# Patient Record
Sex: Female | Born: 1975 | Race: White | Hispanic: No | Smoking: Never smoker
Health system: Southern US, Community
[De-identification: ages and names within clinical notes are randomized; demographics above are authoritative.]

## PROBLEM LIST (undated history)

## (undated) DIAGNOSIS — T7840XA Allergy, unspecified, initial encounter: Secondary | ICD-10-CM

## (undated) DIAGNOSIS — G4700 Insomnia, unspecified: Secondary | ICD-10-CM

## (undated) HISTORY — DX: Insomnia, unspecified: G47.00

## (undated) HISTORY — PX: ELBOW SURGERY: SHX618

## (undated) HISTORY — PX: PARTIAL HYSTERECTOMY: SHX80

## (undated) HISTORY — PX: FINGER AMPUTATION: SHX636

## (undated) HISTORY — DX: Allergy, unspecified, initial encounter: T78.40XA

---

## 2001-06-19 ENCOUNTER — Other Ambulatory Visit: Admission: RE | Admit: 2001-06-19 | Discharge: 2001-06-19 | Payer: Self-pay | Admitting: Family Medicine

## 2003-04-11 ENCOUNTER — Ambulatory Visit (HOSPITAL_COMMUNITY): Admission: RE | Admit: 2003-04-11 | Discharge: 2003-04-11 | Payer: Self-pay | Admitting: Family Medicine

## 2003-10-24 ENCOUNTER — Ambulatory Visit (HOSPITAL_COMMUNITY): Admission: RE | Admit: 2003-10-24 | Discharge: 2003-10-24 | Payer: Self-pay | Admitting: Orthopedic Surgery

## 2005-04-15 ENCOUNTER — Encounter (INDEPENDENT_AMBULATORY_CARE_PROVIDER_SITE_OTHER): Payer: Self-pay | Admitting: *Deleted

## 2005-04-15 ENCOUNTER — Inpatient Hospital Stay (HOSPITAL_COMMUNITY): Admission: RE | Admit: 2005-04-15 | Discharge: 2005-04-17 | Payer: Self-pay | Admitting: *Deleted

## 2007-09-24 ENCOUNTER — Emergency Department (HOSPITAL_COMMUNITY): Admission: EM | Admit: 2007-09-24 | Discharge: 2007-09-24 | Payer: Self-pay | Admitting: Emergency Medicine

## 2007-10-05 ENCOUNTER — Emergency Department (HOSPITAL_COMMUNITY): Admission: EM | Admit: 2007-10-05 | Discharge: 2007-10-05 | Payer: Self-pay | Admitting: Emergency Medicine

## 2007-10-17 ENCOUNTER — Emergency Department (HOSPITAL_COMMUNITY): Admission: EM | Admit: 2007-10-17 | Discharge: 2007-10-17 | Payer: Self-pay | Admitting: Emergency Medicine

## 2007-11-20 ENCOUNTER — Ambulatory Visit (HOSPITAL_COMMUNITY): Admission: RE | Admit: 2007-11-20 | Discharge: 2007-11-20 | Payer: Self-pay | Admitting: Family Medicine

## 2010-06-26 NOTE — Discharge Summary (Signed)
NAME:  [name] Ball, [name] Ball                  ACCOUNT NO.:  [id number]   MEDICAL RECORD NO.:  [id number]          PATIENT TYPE:  INP   LOCATION:  A428                          FACILITY:  [facility]   PHYSICIAN:  Langley Gauss, MD     DATE OF BIRTH:  July 15, 1975   DATE OF ADMISSION:  04/15/2005  DATE OF DISCHARGE:  03/10/2007LH                                 DISCHARGE SUMMARY   DISPOSITION:  The patient is to follow up in the office in 4 days' time for  removal of retention sutures as well as skin staples.  The two JP drains are  left within the subcutaneous space.  The patient is aware of how to empty  these drains.  The patient is given a copy of standard discharge  instructions.   DISCHARGE MEDICATIONS:  Tylox #40 without refill.   PERTINENT LABORATORY STUDIES:  Admission hemoglobin and hematocrit 12.1 and  36.6.  Postoperative day #1, 10.2 and 30.7.  RPR is not performed.  HIV is  nonreactive.   HOSPITAL COURSE:  The patient admitted through outpatient surgery on April 15, 2005.  TAH and bilateral ovarian cystectomy performed without  complications.  Postoperatively the patient did well, she had excellent  urine output, she was able to ambulate and void without difficulty.  Foley  catheter was removed on postoperative day #1.  The patient did initially  have PCA Dilaudid ordered however this failed to achieve any significant  therapeutic response thus the patient was changed to intermittent injections  of IV morphine and Phenergan which did very well for postoperative pain  relief.  The patient remained afebrile.  She began having some gas pains on  postoperative day #1 however on postoperative day #2 the patient is now  fully ambulatory, voiding without difficulty and passing flatus indicating  resumption of bowel function.  She is having no complaints of hot flashes.  She is thus discharged to home on April 17, 2005.  The two JP drains are  secured into place and draining less than 30 mL per  shift, they are  aspirated prior to discharge, no clots are noted and there appears to be  clearing of the drainage noted.  The patient subsequently to follow up in  the office in 4 days' time.      Langley Gauss, MD  Electronically Signed     DC/MEDQ  D:  04/17/2005  T:  04/18/2005  Job:  [id number]
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
## 2010-06-26 NOTE — Op Note (Signed)
NAME:  [name] Ball, [name] Ball                  ACCOUNT NO.:  [id number]   MEDICAL RECORD NO.:  [id number]          PATIENT TYPE:  INP   LOCATION:  A428                          FACILITY:  [facility]   PHYSICIAN:  Langley Gauss, MD     DATE OF BIRTH:  06/15/1975   DATE OF PROCEDURE:  04/15/2005  DATE OF DISCHARGE:  04/17/2005                                 OPERATIVE REPORT   PREOPERATIVE DIAGNOSES:  1.  Dysmenorrhea.  2.  Menorrhagia.   PROCEDURE PERFORMED:  1.  Total abdominal hysterectomy.  2.  Right and left ovarian cystectomies.   SURGEON:  Roylene Reason. Lisette Grinder, MD.   ESTIMATED BLOOD LOSS:  300 mL.   ANALGESIA:  General endotracheal.   DRAINS:  Foley catheter draining the bladder.  Two JP drains are placed in  the subcutaneous space.   TOTAL ESTIMATED BLOOD LOSS:  300 mL.   FINDINGS:  Include a retroflexed uterus with a diffusely enlarged fundal  portion, enlarged ovary with findings consistent with corpora albicans, and  multicystic left ovary with normal-appearing developing follicles.   SUMMARY:  Planned procedure was discussed with the patient. She desired  retention of her ovaries for ovarian functioning.  The patient's vital signs  are stable. She is taken to the operating room. She underwent uncomplicated  induction of general anesthesia and prepped and draped in the usual sterile  manner.  Foley catheter sterilely placed to straight drainage.   A knife is used to excise the redundant skin of the abdominal wall utilizing  a Pfannenstiel's incision in an elliptical fashion.  Subcutaneous bleeders  were then cauterized.  We dissected down to the fascial plane utilizing  cautery. The fascia was then incised in transverse curvilinear manner while  sharply dissecting off the underlying rectus muscle. The fascial edges were  grasped using straight Kocher clamps. Fascia is dissected off the underlying  rectus muscle in the midline both superiorly and inferiorly in the avascular  plane.   Rectus muscles are bluntly separated. Peritoneal cavity is  atraumatically, bluntly entered, at the superior-most portion of the  incision. Peritoneal incision extended superiorly and inferiorly. Inferiorly  we directly palpated the bladder to avoid its accidental injury.   Balfour self-retaining retractor was then placed utilizing shallow blades.  Three moist packs are used to mobilize bowel out of the operative field.  Findings as described previously. Long straight Kocher clamps are used to  grasp the specimen at the junction of the round ligament and utero-ovarian  ligament with the uterus. This allowed manipulation of the operative  specimen. The right round ligament is clamped with Kelly clamp for back  bleeding; #0 Vicryl suture in a Heaney fashion is utilized to secure the  right round ligament. It then transected and skeletonization of the utero-  ovarian ligament is performed on the right.  In the clear space of the broad  ligament a defect. The utero-ovarian ligament is doubly clamped with Kelly  clamps followed by double ligature with free tie of #0 Vicryl, followed by  suture ligature of #0 Vicryl in a Heaney fashion to  secure hemostasis.   The identical procedure was formed on the left with #0 Vicryl in a Heaney  fashion on the round ligament, followed by skeletonization of the utero-  ovarian ligament, followed by double ligation of the left uterine ovarian  ligament with free tie followed by suture ligature. These pedicles were then  secured. This then allowed me to continue the dissection in the broad  ligament across the anterior lower uterine segment in the avascular plane.  This also skeletonizes the uterine vessels bilaterally. The bladder flap was  then bluntly dissected off the anterior lower uterine segment and pushed  distal to the cervix. The right uterine vessel has a Kelly clamp placed for  back bleeding followed by a curved Heaney clamp, followed by #0  Vicryl  sutures to secure hemostasis. Left uterine vessel is clamped with a curved  Heaney clamp, followed by #0 Vicryl suture to likewise secure hemostasis.   The cervix is palpated, a straight Heaney clamp is placed on the right  cardinal ligament very close to the cervix. Suture ligature of #0 Vicryl in  a Heaney fashion is utilized. Left cardinal ligament is likewise clamped  with a straight Heaney clamp, followed by suture ligature of #0 Vicryl in  Heaney fashion.  This then brought me down to the vaginal angle; the right  vaginal angle incorporating the uterosacral ligament is clamped with a  curved Heaney clamp on the right, followed by suture ligature of #0 Vicryl  in a Heaney fashion. Left vaginal angle is likewise clamped with a curved  Heaney clamp, suture ligature of #0 Vicryl in a Heaney fashion is also  placed. These had been tagged for later inspection. This allows me to  dissect across the vaginal cuff removing the entirety of the operative  specimen. Cervix was examined and noted be contained its entirety in the  operative specimen. The vaginal cuff was then closed with figure-of-eight  sutures of #0 Vicryl to secure hemostasis.   This results in an excellent closure. No active bleeding is noted from the  vaginal cuff.  However, some bleeding is noted in the area of the broad  ligament bilaterally where dissection had been performed to skeletonize the  utero-ovarian ligament.  Thus 3-0 Vicryl suture in a continuous fashion is  used to reperitonealized on both the right and left which also results in  hemostasis. The right ovary is noted to be diffusely enlarged. Manipulation  results in rupture of a cyst which then is noted to peel away. Cyst wall is  removed. This is consistent with a corpora albicans.  The right ovary is  noted to have some active bleeding from this site which is then controlled utilizing a series of interrupted #0 Vicryl sutures to secure hemostasis.   The left ovary is likewise examined, a small follicular cyst is noted to  rupture. The follicular cyst wall is removed and figure-of-eight sutures of  #0 Vicryl are used to reapproximate the ovarian stroma. In an effort to  avoid postoperative dyspareunia, each of the ovaries is then elevated with a  suture placed through the ovarian stroma into the round ligament, then for  very gentle elevation.   Irrigation is then performed of the pelvic cavity to assure hemostasis.  Irrigation is formed until irrigant is clear. Sponge, needle, and instrument  counts are correct x2, at this point. Thus moist packs are removed. Large  bowel was placed within the pelvis with overlying omentum peritoneal edges  are grasped using  Kelly clamps. The peritoneum and overlying rectus muscles  closed with a continuous running #0 Vicryl suture. Subfascial bleeders are  cauterized. The fascia is then closed with a continuous running #1 PDS  suture. Subcutaneous bleeders are cauterized and two JP drains are placed in  the subcutaneous space with exit wounds to left-and-right apex of the  incision. These are sutured into place. Three horizontal mattress sutures of  #1 PDS were then placed as retention-type sutures. Skin is closed utilizing  skin staples. A total of 30 mL of 0.5% bupivacaine plain is then injected  along the incision to facilitate  postoperative analgesia. The patient's vital signs remained stable. She is  then reversed of anesthesia, taken to the recovery room in stable condition.  Operative findings then discussed with the patient's mother who is in the  postoperative waiting area. The patient is in the recovery room in stable  condition.      Langley Gauss, MD  Electronically Signed     DC/MEDQ  D:  04/17/2005  T:  04/19/2005  Job:  [id number]

## 2010-11-11 LAB — DIFFERENTIAL
Basophils Absolute: 0
Basophils Relative: 0
Eosinophils Absolute: 0.1
Eosinophils Relative: 1
Lymphocytes Relative: 10 — ABNORMAL LOW
Lymphs Abs: 1
Monocytes Absolute: 0.4
Monocytes Relative: 4
Neutrophils Relative %: 85 — ABNORMAL HIGH

## 2010-11-11 LAB — URINALYSIS, ROUTINE W REFLEX MICROSCOPIC
Glucose, UA: NEGATIVE
Leukocytes, UA: NEGATIVE
Specific Gravity, Urine: >1.03 — ABNORMAL HIGH
pH: 5.5

## 2010-11-11 LAB — BASIC METABOLIC PANEL
CO2: 29
Chloride: 105
GFR calc Af Amer: >60
GFR calc non Af Amer: >60
Potassium: 3.7
Sodium: 141

## 2010-11-11 LAB — CBC
Hemoglobin: 14.3
MCHC: 34.3
WBC: 10.2

## 2010-11-11 LAB — URINE MICROSCOPIC-ADD ON

## 2011-10-16 ENCOUNTER — Emergency Department (HOSPITAL_COMMUNITY): Payer: Managed Care, Other (non HMO)

## 2011-10-16 ENCOUNTER — Emergency Department (HOSPITAL_COMMUNITY)
Admission: EM | Admit: 2011-10-16 | Discharge: 2011-10-16 | Disposition: A | Discharge status: Home / Self Care | Payer: Managed Care, Other (non HMO) | Attending: Emergency Medicine | Admitting: Emergency Medicine

## 2011-10-16 ENCOUNTER — Encounter (HOSPITAL_COMMUNITY): Payer: Self-pay | Admitting: Emergency Medicine

## 2011-10-16 DIAGNOSIS — S51019A Laceration without foreign body of unspecified elbow, initial encounter: Secondary | ICD-10-CM

## 2011-10-16 DIAGNOSIS — S51009A Unspecified open wound of unspecified elbow, initial encounter: Secondary | ICD-10-CM | POA: Insufficient documentation

## 2011-10-16 DIAGNOSIS — IMO0002 Reserved for concepts with insufficient information to code with codable children: Secondary | ICD-10-CM | POA: Insufficient documentation

## 2011-10-16 DIAGNOSIS — M25529 Pain in unspecified elbow: Secondary | ICD-10-CM | POA: Insufficient documentation

## 2011-10-16 DIAGNOSIS — M79609 Pain in unspecified limb: Secondary | ICD-10-CM | POA: Insufficient documentation

## 2011-10-16 DIAGNOSIS — S62339A Displaced fracture of neck of unspecified metacarpal bone, initial encounter for closed fracture: Secondary | ICD-10-CM | POA: Insufficient documentation

## 2011-10-16 DIAGNOSIS — Z23 Encounter for immunization: Secondary | ICD-10-CM | POA: Insufficient documentation

## 2011-10-16 DIAGNOSIS — R079 Chest pain, unspecified: Secondary | ICD-10-CM | POA: Insufficient documentation

## 2011-10-16 DIAGNOSIS — S62309A Unspecified fracture of unspecified metacarpal bone, initial encounter for closed fracture: Secondary | ICD-10-CM

## 2011-10-16 DIAGNOSIS — T07XXXA Unspecified multiple injuries, initial encounter: Secondary | ICD-10-CM

## 2011-10-16 MED ORDER — ONDANSETRON 4 MG PO TBDP
ORAL_TABLET | ORAL | Status: AC
  Administered 2011-10-16: 07:00:00
  Filled 2011-10-16: qty 1

## 2011-10-16 MED ORDER — HYDROMORPHONE HCL PF 2 MG/ML IJ SOLN
2.0000 mg | Freq: Once | INTRAMUSCULAR | Status: AC
  Administered 2011-10-16: 2 mg via INTRAMUSCULAR
  Filled 2011-10-16: qty 1

## 2011-10-16 MED ORDER — OXYCODONE-ACETAMINOPHEN 5-325 MG PO TABS: 1.0000 | ORAL_TABLET | ORAL | Status: DC | PRN

## 2011-10-16 MED ORDER — OXYCODONE-ACETAMINOPHEN 5-325 MG PO TABS
2.0000 | ORAL_TABLET | Freq: Once | ORAL | Status: AC
  Administered 2011-10-16: 2 via ORAL
  Filled 2011-10-16: qty 2

## 2011-10-16 MED ORDER — TETANUS-DIPHTH-ACELL PERTUSSIS 5-2.5-18.5 LF-MCG/0.5 IM SUSP
0.5000 mL | Freq: Once | INTRAMUSCULAR | Status: AC
  Administered 2011-10-16: 0.5 mL via INTRAMUSCULAR
  Filled 2011-10-16: qty 0.5
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
## 2011-10-16 NOTE — ED Notes (Signed)
[employer] at bedside 

## 2011-10-16 NOTE — ED Notes (Signed)
PT ambulated with baseline gait; VSS; A&Ox3; no signs of distress; respirations even and unlabored; skin warm and dry; no questions upon discharge.  

## 2011-10-16 NOTE — ED Notes (Signed)
Wound irrigated with 4000 L NS; suture cart at bedside.

## 2011-10-16 NOTE — ED Notes (Signed)
Dr. Effie Shy sutured and cleaned wound. Ortho to be called.

## 2011-10-16 NOTE — ED Notes (Signed)
Ortho at bedside.

## 2011-10-16 NOTE — ED Notes (Signed)
PT no longer nauseated.
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
## 2011-10-16 NOTE — ED Notes (Addendum)
Pt removed from backboard; no c/o back pain; c collar remains in place.

## 2011-10-18 ENCOUNTER — Other Ambulatory Visit (HOSPITAL_COMMUNITY): Payer: Self-pay | Admitting: Orthopaedic Surgery

## 2011-10-18 ENCOUNTER — Encounter (HOSPITAL_COMMUNITY): Payer: Self-pay | Admitting: Anesthesiology

## 2011-10-18 ENCOUNTER — Encounter (HOSPITAL_COMMUNITY): Payer: Self-pay | Admitting: *Deleted

## 2011-10-18 ENCOUNTER — Inpatient Hospital Stay (HOSPITAL_COMMUNITY)
Admission: AD | Admit: 2011-10-18 | Discharge: 2011-10-26 | DRG: 577 | Disposition: A | Discharge status: Home / Self Care | Payer: Managed Care, Other (non HMO) | Source: Ambulatory Visit | Attending: Orthopaedic Surgery | Admitting: Orthopaedic Surgery

## 2011-10-18 ENCOUNTER — Inpatient Hospital Stay (HOSPITAL_COMMUNITY): Payer: Managed Care, Other (non HMO) | Admitting: Anesthesiology

## 2011-10-18 ENCOUNTER — Encounter (HOSPITAL_COMMUNITY)
Admission: AD | Disposition: A | Discharge status: Home / Self Care | Payer: Self-pay | Source: Ambulatory Visit | Attending: Orthopaedic Surgery

## 2011-10-18 DIAGNOSIS — IMO0002 Reserved for concepts with insufficient information to code with codable children: Principal | ICD-10-CM | POA: Diagnosis present

## 2011-10-18 DIAGNOSIS — L089 Local infection of the skin and subcutaneous tissue, unspecified: Secondary | ICD-10-CM

## 2011-10-18 DIAGNOSIS — B961 Klebsiella pneumoniae [K. pneumoniae] as the cause of diseases classified elsewhere: Secondary | ICD-10-CM | POA: Diagnosis present

## 2011-10-18 DIAGNOSIS — A498 Other bacterial infections of unspecified site: Secondary | ICD-10-CM | POA: Diagnosis present

## 2011-10-18 DIAGNOSIS — Y998 Other external cause status: Secondary | ICD-10-CM

## 2011-10-18 HISTORY — PX: I&D EXTREMITY: SHX5045

## 2011-10-18 LAB — BASIC METABOLIC PANEL
BUN: 7 mg/dL (ref 6–23)
Calcium: 9.5 mg/dL (ref 8.4–10.5)
Creatinine, Ser: 0.69 mg/dL (ref 0.50–1.10)
GFR calc non Af Amer: >90 mL/min (ref >90)
Glucose, Bld: 100 mg/dL — ABNORMAL HIGH (ref 70–99)
Sodium: 134 mEq/L — ABNORMAL LOW (ref 135–145)

## 2011-10-18 LAB — CBC
MCV: 88 fL (ref 78.0–100.0)
Platelets: 225 10*3/uL (ref 150–400)
RBC: 4.35 MIL/uL (ref 3.87–5.11)
WBC: 15 10*3/uL — ABNORMAL HIGH (ref 4.0–10.5)

## 2011-10-18 LAB — GRAM STAIN

## 2011-10-18 SURGERY — IRRIGATION AND DEBRIDEMENT EXTREMITY
Anesthesia: General | Site: Elbow | Laterality: Right | Wound class: Dirty or Infected

## 2011-10-18 MED ORDER — CLINDAMYCIN PHOSPHATE 600 MG/50ML IV SOLN
INTRAVENOUS | Status: AC
  Filled 2011-10-18: qty 50

## 2011-10-18 MED ORDER — PROPOFOL 10 MG/ML IV EMUL
INTRAVENOUS | Status: DC | PRN
  Administered 2011-10-18: 200 mg via INTRAVENOUS

## 2011-10-18 MED ORDER — CLINDAMYCIN PHOSPHATE 600 MG/50ML IV SOLN
INTRAVENOUS | Status: DC | PRN
  Administered 2011-10-18: 600 mg via INTRAVENOUS

## 2011-10-18 MED ORDER — VANCOMYCIN HCL IN DEXTROSE 1-5 GM/200ML-% IV SOLN
1000.0000 mg | Freq: Three times a day (TID) | INTRAVENOUS | Status: DC
  Administered 2011-10-18 – 2011-10-20 (×5): 1000 mg via INTRAVENOUS
  Filled 2011-10-18 (×6): qty 200

## 2011-10-18 MED ORDER — HYDROMORPHONE HCL PF 1 MG/ML IJ SOLN
INTRAMUSCULAR | Status: AC
  Administered 2011-10-18: 0.5 mg via INTRAVENOUS
  Filled 2011-10-18: qty 1

## 2011-10-18 MED ORDER — ONDANSETRON HCL 4 MG/2ML IJ SOLN: 4.0000 mg | Freq: Four times a day (QID) | INTRAMUSCULAR | Status: DC | PRN

## 2011-10-18 MED ORDER — ONDANSETRON HCL 4 MG PO TABS: 4.0000 mg | ORAL_TABLET | Freq: Four times a day (QID) | ORAL | Status: DC | PRN

## 2011-10-18 MED ORDER — ACETAMINOPHEN 10 MG/ML IV SOLN
INTRAVENOUS | Status: DC | PRN
  Administered 2011-10-18: 1000 mg via INTRAVENOUS

## 2011-10-18 MED ORDER — OXYCODONE-ACETAMINOPHEN 5-325 MG PO TABS
ORAL_TABLET | ORAL | Status: AC
  Filled 2011-10-18: qty 1

## 2011-10-18 MED ORDER — METHOCARBAMOL 500 MG PO TABS
500.0000 mg | ORAL_TABLET | Freq: Four times a day (QID) | ORAL | Status: DC | PRN
  Administered 2011-10-22 – 2011-10-25 (×8): 500 mg via ORAL
  Filled 2011-10-18 (×7): qty 1

## 2011-10-18 MED ORDER — OXYCODONE HCL 5 MG PO TABS
5.0000 mg | ORAL_TABLET | ORAL | Status: DC | PRN
  Administered 2011-10-18 – 2011-10-26 (×19): 10 mg via ORAL
  Filled 2011-10-18 (×18): qty 2

## 2011-10-18 MED ORDER — METOCLOPRAMIDE HCL 10 MG PO TABS: 5.0000 mg | ORAL_TABLET | Freq: Three times a day (TID) | ORAL | Status: DC | PRN

## 2011-10-18 MED ORDER — MORPHINE SULFATE 2 MG/ML IJ SOLN
1.0000 mg | INTRAMUSCULAR | Status: DC | PRN
  Administered 2011-10-18 – 2011-10-19 (×2): 1 mg via INTRAVENOUS
  Filled 2011-10-18: qty 1

## 2011-10-18 MED ORDER — SODIUM CHLORIDE 0.9 % IR SOLN
Status: DC | PRN
  Administered 2011-10-18: 9000 mL

## 2011-10-18 MED ORDER — HYDROCODONE-ACETAMINOPHEN 5-325 MG PO TABS
1.0000 | ORAL_TABLET | ORAL | Status: DC | PRN
  Administered 2011-10-19 – 2011-10-26 (×11): 2 via ORAL
  Filled 2011-10-18 (×13): qty 2

## 2011-10-18 MED ORDER — MIDAZOLAM HCL 5 MG/5ML IJ SOLN
INTRAMUSCULAR | Status: DC | PRN
  Administered 2011-10-18: 2 mg via INTRAVENOUS

## 2011-10-18 MED ORDER — LIDOCAINE HCL (CARDIAC) 20 MG/ML IV SOLN
INTRAVENOUS | Status: DC | PRN
  Administered 2011-10-18: 80 mg via INTRAVENOUS

## 2011-10-18 MED ORDER — OXYCODONE-ACETAMINOPHEN 5-325 MG PO TABS
1.0000 | ORAL_TABLET | Freq: Once | ORAL | Status: AC
  Administered 2011-10-18: 1 via ORAL

## 2011-10-18 MED ORDER — DIPHENHYDRAMINE HCL 12.5 MG/5ML PO ELIX
12.5000 mg | ORAL_SOLUTION | ORAL | Status: DC | PRN
  Administered 2011-10-24 (×2): 12.5 mg via ORAL
  Administered 2011-10-24 – 2011-10-26 (×6): 25 mg via ORAL
  Filled 2011-10-18 (×4): qty 5
  Filled 2011-10-18: qty 10
  Filled 2011-10-18 (×3): qty 5

## 2011-10-18 MED ORDER — DOCUSATE SODIUM 100 MG PO CAPS
100.0000 mg | ORAL_CAPSULE | Freq: Two times a day (BID) | ORAL | Status: DC
  Administered 2011-10-18 – 2011-10-26 (×16): 100 mg via ORAL
  Filled 2011-10-18 (×20): qty 1

## 2011-10-18 MED ORDER — SODIUM CHLORIDE 0.9 % IV SOLN
INTRAVENOUS | Status: DC
  Administered 2011-10-23: 11:00:00 via INTRAVENOUS
  Administered 2011-10-24: 75 mL/h via INTRAVENOUS

## 2011-10-18 MED ORDER — ACETAMINOPHEN 10 MG/ML IV SOLN
INTRAVENOUS | Status: AC
  Filled 2011-10-18: qty 100

## 2011-10-18 MED ORDER — FENTANYL CITRATE 0.05 MG/ML IJ SOLN
INTRAMUSCULAR | Status: DC | PRN
  Administered 2011-10-18 (×3): 25 ug via INTRAVENOUS
  Administered 2011-10-18: 50 ug via INTRAVENOUS
  Administered 2011-10-18 (×5): 25 ug via INTRAVENOUS

## 2011-10-18 MED ORDER — MORPHINE SULFATE 2 MG/ML IJ SOLN
INTRAMUSCULAR | Status: AC
  Filled 2011-10-18: qty 1

## 2011-10-18 MED ORDER — LACTATED RINGERS IV SOLN
INTRAVENOUS | Status: DC | PRN
  Administered 2011-10-18 (×2): via INTRAVENOUS

## 2011-10-18 MED ORDER — ONDANSETRON HCL 4 MG/2ML IJ SOLN
INTRAMUSCULAR | Status: DC | PRN
  Administered 2011-10-18: 4 mg via INTRAVENOUS

## 2011-10-18 MED ORDER — MUPIROCIN 2 % EX OINT
TOPICAL_OINTMENT | Freq: Two times a day (BID) | CUTANEOUS | Status: DC
  Administered 2011-10-18: 1 via NASAL

## 2011-10-18 MED ORDER — HYDROMORPHONE HCL PF 1 MG/ML IJ SOLN
0.2500 mg | INTRAMUSCULAR | Status: DC | PRN
  Administered 2011-10-18 (×4): 0.5 mg via INTRAVENOUS

## 2011-10-18 MED ORDER — MUPIROCIN 2 % EX OINT
TOPICAL_OINTMENT | CUTANEOUS | Status: AC
  Administered 2011-10-18: 1 via NASAL
  Filled 2011-10-18: qty 22

## 2011-10-18 MED ORDER — PIPERACILLIN-TAZOBACTAM 3.375 G IVPB
3.3750 g | Freq: Three times a day (TID) | INTRAVENOUS | Status: DC
  Administered 2011-10-18 – 2011-10-22 (×10): 3.375 g via INTRAVENOUS
  Filled 2011-10-18 (×13): qty 50

## 2011-10-18 MED ORDER — LACTATED RINGERS IV SOLN
INTRAVENOUS | Status: DC
  Administered 2011-10-18: 18:00:00 via INTRAVENOUS

## 2011-10-18 MED ORDER — DEXTROSE 5 % IV SOLN
500.0000 mg | INTRAVENOUS | Status: DC
  Administered 2011-10-18: 500 mg via INTRAVENOUS
  Filled 2011-10-18: qty 5

## 2011-10-18 MED ORDER — CLINDAMYCIN PHOSPHATE 900 MG/50ML IV SOLN: 900.0000 mg | INTRAVENOUS | Status: DC

## 2011-10-18 MED ORDER — HYDROMORPHONE HCL PF 1 MG/ML IJ SOLN
1.0000 mg | INTRAMUSCULAR | Status: DC | PRN
  Administered 2011-10-19 – 2011-10-24 (×26): 1 mg via INTRAVENOUS
  Filled 2011-10-18 (×31): qty 1

## 2011-10-18 MED ORDER — METOCLOPRAMIDE HCL 5 MG/ML IJ SOLN
5.0000 mg | Freq: Three times a day (TID) | INTRAMUSCULAR | Status: DC | PRN
Start: 2011-10-18 — End: 2011-10-26

## 2011-10-18 MED ORDER — ONDANSETRON HCL 4 MG/2ML IJ SOLN
4.0000 mg | Freq: Four times a day (QID) | INTRAMUSCULAR | Status: DC | PRN
  Administered 2011-10-22: 4 mg via INTRAVENOUS
  Filled 2011-10-18 (×2): qty 2

## 2011-10-18 MED ORDER — ZOLPIDEM TARTRATE 5 MG PO TABS
5.0000 mg | ORAL_TABLET | Freq: Every evening | ORAL | Status: DC | PRN
  Administered 2011-10-18: 5 mg via ORAL
  Filled 2011-10-18: qty 1

## 2011-10-18 MED ORDER — CEFAZOLIN SODIUM-DEXTROSE 2-3 GM-% IV SOLR
INTRAVENOUS | Status: AC
  Filled 2011-10-18: qty 50

## 2011-10-18 MED ORDER — METHOCARBAMOL 100 MG/ML IJ SOLN
500.0000 mg | Freq: Four times a day (QID) | INTRAVENOUS | Status: DC | PRN
  Filled 2011-10-18: qty 5

## 2011-10-18 SURGICAL SUPPLY — 56 items
BANDAGE CONFORM 3  STR LF (GAUZE/BANDAGES/DRESSINGS) IMPLANT
BANDAGE ELASTIC 3 VELCRO ST LF (GAUZE/BANDAGES/DRESSINGS) IMPLANT
BLADE SURG 10 STRL SS (BLADE) ×2 IMPLANT
BNDG COHESIVE 1X5 TAN STRL LF (GAUZE/BANDAGES/DRESSINGS) IMPLANT
BNDG COHESIVE 4X5 TAN STRL (GAUZE/BANDAGES/DRESSINGS) ×2 IMPLANT
BNDG COHESIVE 6X5 TAN STRL LF (GAUZE/BANDAGES/DRESSINGS) ×4 IMPLANT
BNDG GAUZE STRTCH 6 (GAUZE/BANDAGES/DRESSINGS) ×6 IMPLANT
CLOTH BEACON ORANGE TIMEOUT ST (SAFETY) ×2 IMPLANT
CORDS BIPOLAR (ELECTRODE) IMPLANT
COVER SURGICAL LIGHT HANDLE (MISCELLANEOUS) ×2 IMPLANT
CUFF TOURNIQUET SINGLE 18IN (TOURNIQUET CUFF) ×2 IMPLANT
CUFF TOURNIQUET SINGLE 24IN (TOURNIQUET CUFF) IMPLANT
CUFF TOURNIQUET SINGLE 34IN LL (TOURNIQUET CUFF) IMPLANT
CUFF TOURNIQUET SINGLE 44IN (TOURNIQUET CUFF) IMPLANT
DRAPE ORTHO SPLIT 77X108 STRL (DRAPES) ×2
DRAPE SURG 17X23 STRL (DRAPES) IMPLANT
DRAPE SURG ORHT 6 SPLT 77X108 (DRAPES) ×2 IMPLANT
DRAPE U-SHAPE 47X51 STRL (DRAPES) ×2 IMPLANT
DRSG ADAPTIC 3X8 NADH LF (GAUZE/BANDAGES/DRESSINGS) ×2 IMPLANT
DRSG VAC ATS MED SENSATRAC (GAUZE/BANDAGES/DRESSINGS) ×2 IMPLANT
DURAPREP 26ML APPLICATOR (WOUND CARE) ×2 IMPLANT
ELECT CAUTERY BLADE 6.4 (BLADE) IMPLANT
ELECT REM PT RETURN 9FT ADLT (ELECTROSURGICAL)
ELECTRODE REM PT RTRN 9FT ADLT (ELECTROSURGICAL) IMPLANT
GAUZE XEROFORM 1X8 LF (GAUZE/BANDAGES/DRESSINGS) ×2 IMPLANT
GLOVE BIOGEL PI IND STRL 8 (GLOVE) ×2 IMPLANT
GLOVE BIOGEL PI INDICATOR 8 (GLOVE) ×2
GLOVE ORTHO TXT STRL SZ7.5 (GLOVE) ×2 IMPLANT
GOWN PREVENTION PLUS LG XLONG (DISPOSABLE) IMPLANT
GOWN PREVENTION PLUS XLARGE (GOWN DISPOSABLE) ×2 IMPLANT
GOWN STRL NON-REIN LRG LVL3 (GOWN DISPOSABLE) ×2 IMPLANT
HANDPIECE INTERPULSE COAX TIP (DISPOSABLE)
KIT BASIN OR (CUSTOM PROCEDURE TRAY) ×2 IMPLANT
KIT ROOM TURNOVER OR (KITS) ×2 IMPLANT
MANIFOLD NEPTUNE II (INSTRUMENTS) ×2 IMPLANT
NS IRRIG 1000ML POUR BTL (IV SOLUTION) ×2 IMPLANT
PACK ORTHO EXTREMITY (CUSTOM PROCEDURE TRAY) ×2 IMPLANT
PAD ARMBOARD 7.5X6 YLW CONV (MISCELLANEOUS) ×4 IMPLANT
PADDING CAST ABS 4INX4YD NS (CAST SUPPLIES) ×2
PADDING CAST ABS COTTON 4X4 ST (CAST SUPPLIES) ×2 IMPLANT
PADDING CAST COTTON 6X4 STRL (CAST SUPPLIES) ×2 IMPLANT
SET HNDPC FAN SPRY TIP SCT (DISPOSABLE) IMPLANT
SPONGE GAUZE 4X4 12PLY (GAUZE/BANDAGES/DRESSINGS) ×2 IMPLANT
SPONGE LAP 18X18 X RAY DECT (DISPOSABLE) ×2 IMPLANT
STOCKINETTE IMPERVIOUS 9X36 MD (GAUZE/BANDAGES/DRESSINGS) ×2 IMPLANT
SUT ETHILON 2 0 FS 18 (SUTURE) ×6 IMPLANT
SUT ETHILON 3 0 PS 1 (SUTURE) ×4 IMPLANT
SYR CONTROL 10ML LL (SYRINGE) IMPLANT
TOWEL OR 17X24 6PK STRL BLUE (TOWEL DISPOSABLE) ×2 IMPLANT
TOWEL OR 17X26 10 PK STRL BLUE (TOWEL DISPOSABLE) ×2 IMPLANT
TUBE ANAEROBIC SPECIMEN COL (MISCELLANEOUS) IMPLANT
TUBE CONNECTING 12X1/4 (SUCTIONS) ×2 IMPLANT
TUBE FEEDING 5FR 15 INCH (TUBING) IMPLANT
UNDERPAD 30X30 INCONTINENT (UNDERPADS AND DIAPERS) ×2 IMPLANT
WATER STERILE IRR 1000ML POUR (IV SOLUTION) ×2 IMPLANT
YANKAUER SUCT BULB TIP NO VENT (SUCTIONS) ×2 IMPLANT

## 2011-10-18 NOTE — Progress Notes (Signed)
Patient reports left sided chest pain/ soreness. Notified Dr. Chaney Malling orders given for 12 lead EKG. Report given to Leandro Reasoner, RN
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
## 2011-10-18 NOTE — Preoperative (Signed)
Beta Blockers   Reason not to administer Beta Blockers:Not Applicable 

## 2011-10-18 NOTE — Anesthesia Preprocedure Evaluation (Signed)
Anesthesia Evaluation  Patient identified by MRN, date of birth, ID band Patient awake    Reviewed: Allergy & Precautions, H&P , NPO status , Patient's Chart, lab work & pertinent test results  Airway Mallampati: II  Neck ROM: full    Dental   Pulmonary          Cardiovascular     Neuro/Psych    GI/Hepatic   Endo/Other    Renal/GU      Musculoskeletal   Abdominal   Peds  Hematology   Anesthesia Other Findings   Reproductive/Obstetrics                          Anesthesia Physical Anesthesia Plan  ASA: I  Anesthesia Plan: General   Post-op Pain Management:    Induction: Intravenous  Airway Management Planned: LMA  Additional Equipment:   Intra-op Plan:   Post-operative Plan:   Informed Consent: I have reviewed the patients History and Physical, chart, labs and discussed the procedure including the risks, benefits and alternatives for the proposed anesthesia with the patient or authorized representative who has indicated his/her understanding and acceptance.     Plan Discussed with: CRNA and Surgeon  Anesthesia Plan Comments:        Anesthesia Quick Evaluation  

## 2011-10-18 NOTE — Brief Op Note (Signed)
10/18/2011  7:48 PM  PATIENT:  [name] Ball  36 y.o. female  PRE-OPERATIVE DIAGNOSIS:  Right elbow infection  POST-OPERATIVE DIAGNOSIS:  Right elbow infection  PROCEDURE:  Procedure(s) (LRB) with comments: IRRIGATION AND DEBRIDEMENT EXTREMITY (Right) - Right elbow irrigation and debridement  SURGEON:  Surgeon(s) and Role:    * Kathryne Hitch, MD - Primary  PHYSICIAN ASSISTANT:   ASSISTANTS: none   ANESTHESIA:   general  EBL:  Total I/O In: 1000 [I.V.:1000] Out: -   BLOOD ADMINISTERED:none  DRAINS: (medium) Hemovact drain(s) in the arm wound with  Suction Open   LOCAL MEDICATIONS USED:  NONE  SPECIMEN:  No Specimen  DISPOSITION OF SPECIMEN:  N/A  COUNTS:  YES  TOURNIQUET:  * No tourniquets in log *  DICTATION: .Other Dictation: Dictation Number [id number]  PLAN OF CARE: Admit to inpatient   PATIENT DISPOSITION:  PACU - hemodynamically stable.   Delay start of Pharmacological VTE agent (>24hrs) due to surgical blood loss or risk of bleeding: no

## 2011-10-18 NOTE — Transfer of Care (Signed)
Immediate Anesthesia Transfer of Care Note  Patient: [name] Ball  Procedure(s) Performed: Procedure(s) (LRB) with comments: IRRIGATION AND DEBRIDEMENT EXTREMITY (Right) - Right elbow irrigation and debridement  Patient Location: PACU  Anesthesia Type: General  Level of Consciousness: awake, oriented and patient cooperative  Airway & Oxygen Therapy: Patient Spontanous Breathing and Patient connected to nasal cannula oxygen  Post-op Assessment: Report given to PACU RN, Post -op Vital signs reviewed and stable and Patient moving all extremities X 4  Post vital signs: Reviewed and stable  Complications: No apparent anesthesia complications
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
## 2011-10-18 NOTE — Anesthesia Postprocedure Evaluation (Signed)
  Anesthesia Post-op Note  Patient: [name] Ball  Procedure(s) Performed: Procedure(s) (LRB) with comments: IRRIGATION AND DEBRIDEMENT EXTREMITY (Right) - Right elbow irrigation and debridement  Patient Location: PACU  Anesthesia Type: General  Level of Consciousness: awake, oriented and patient cooperative  Airway and Oxygen Therapy: Patient Spontanous Breathing  Post-op Pain: mild  Post-op Assessment: Post-op Vital signs reviewed, Patient's Cardiovascular Status Stable, Respiratory Function Stable, Patent Airway, No signs of Nausea or vomiting and Pain level controlled  Post-op Vital Signs: stable  Complications: No apparent anesthesia complications

## 2011-10-19 ENCOUNTER — Encounter (HOSPITAL_COMMUNITY): Payer: Self-pay | Admitting: Anesthesiology

## 2011-10-19 ENCOUNTER — Encounter (HOSPITAL_COMMUNITY): Payer: Self-pay | Admitting: Orthopaedic Surgery

## 2011-10-19 ENCOUNTER — Encounter (HOSPITAL_COMMUNITY)
Admission: AD | Disposition: A | Discharge status: Home / Self Care | Payer: Self-pay | Source: Ambulatory Visit | Attending: Orthopaedic Surgery

## 2011-10-19 ENCOUNTER — Inpatient Hospital Stay (HOSPITAL_COMMUNITY): Payer: Managed Care, Other (non HMO) | Admitting: Anesthesiology

## 2011-10-19 HISTORY — PX: I&D EXTREMITY: SHX5045

## 2011-10-19 LAB — BASIC METABOLIC PANEL
BUN: 6 mg/dL (ref 6–23)
GFR calc Af Amer: >90 mL/min (ref >90)
GFR calc non Af Amer: >90 mL/min (ref >90)
Potassium: 3.3 mEq/L — ABNORMAL LOW (ref 3.5–5.1)

## 2011-10-19 LAB — CBC
Hemoglobin: 10.8 g/dL — ABNORMAL LOW (ref 12.0–15.0)
MCHC: 33.3 g/dL (ref 30.0–36.0)
RDW: 13.3 % (ref 11.5–15.5)
WBC: 9.4 10*3/uL (ref 4.0–10.5)

## 2011-10-19 SURGERY — IRRIGATION AND DEBRIDEMENT EXTREMITY
Anesthesia: General | Site: Elbow | Laterality: Right | Wound class: Dirty or Infected

## 2011-10-19 MED ORDER — LACTATED RINGERS IV SOLN
INTRAVENOUS | Status: DC
  Administered 2011-10-19 – 2011-10-22 (×2): via INTRAVENOUS

## 2011-10-19 MED ORDER — HYDROMORPHONE HCL PF 1 MG/ML IJ SOLN
0.2500 mg | INTRAMUSCULAR | Status: DC | PRN
  Administered 2011-10-19 (×4): 0.5 mg via INTRAVENOUS

## 2011-10-19 MED ORDER — FENTANYL CITRATE 0.05 MG/ML IJ SOLN
INTRAMUSCULAR | Status: DC | PRN
  Administered 2011-10-19 (×4): 50 ug via INTRAVENOUS

## 2011-10-19 MED ORDER — SODIUM CHLORIDE 0.9 % IR SOLN
Status: DC | PRN
  Administered 2011-10-19: 6000 mL

## 2011-10-19 MED ORDER — MEPERIDINE HCL 25 MG/ML IJ SOLN: 6.2500 mg | INTRAMUSCULAR | Status: DC | PRN

## 2011-10-19 MED ORDER — PROPOFOL 10 MG/ML IV BOLUS
INTRAVENOUS | Status: DC | PRN
  Administered 2011-10-19: 200 mg via INTRAVENOUS

## 2011-10-19 MED ORDER — MIDAZOLAM HCL 2 MG/2ML IJ SOLN: 0.5000 mg | Freq: Once | INTRAMUSCULAR | Status: AC | PRN

## 2011-10-19 MED ORDER — PROMETHAZINE HCL 25 MG/ML IJ SOLN: 6.2500 mg | INTRAMUSCULAR | Status: DC | PRN

## 2011-10-19 MED ORDER — LIDOCAINE HCL (CARDIAC) 20 MG/ML IV SOLN
INTRAVENOUS | Status: DC | PRN
  Administered 2011-10-19: 40 mg via INTRAVENOUS

## 2011-10-19 MED ORDER — ACETAMINOPHEN 325 MG PO TABS
650.0000 mg | ORAL_TABLET | Freq: Four times a day (QID) | ORAL | Status: DC | PRN
  Administered 2011-10-21: 650 mg via ORAL
  Filled 2011-10-19: qty 2

## 2011-10-19 MED ORDER — LACTATED RINGERS IV SOLN
INTRAVENOUS | Status: DC | PRN
  Administered 2011-10-19: 18:00:00 via INTRAVENOUS

## 2011-10-19 MED ORDER — IBUPROFEN 800 MG PO TABS
800.0000 mg | ORAL_TABLET | Freq: Four times a day (QID) | ORAL | Status: DC | PRN
  Administered 2011-10-24: 800 mg via ORAL
  Filled 2011-10-19 (×2): qty 1

## 2011-10-19 MED ORDER — MIDAZOLAM HCL 5 MG/5ML IJ SOLN
INTRAMUSCULAR | Status: DC | PRN
  Administered 2011-10-19: 2 mg via INTRAVENOUS

## 2011-10-19 SURGICAL SUPPLY — 55 items
BANDAGE CONFORM 3  STR LF (GAUZE/BANDAGES/DRESSINGS) IMPLANT
BANDAGE ELASTIC 3 VELCRO ST LF (GAUZE/BANDAGES/DRESSINGS) IMPLANT
BLADE SURG 10 STRL SS (BLADE) ×2 IMPLANT
BNDG COHESIVE 1X5 TAN STRL LF (GAUZE/BANDAGES/DRESSINGS) IMPLANT
BNDG COHESIVE 4X5 TAN STRL (GAUZE/BANDAGES/DRESSINGS) ×2 IMPLANT
BNDG COHESIVE 6X5 TAN STRL LF (GAUZE/BANDAGES/DRESSINGS) ×4 IMPLANT
BNDG GAUZE STRTCH 6 (GAUZE/BANDAGES/DRESSINGS) ×6 IMPLANT
CLOTH BEACON ORANGE TIMEOUT ST (SAFETY) ×2 IMPLANT
CORDS BIPOLAR (ELECTRODE) IMPLANT
COVER SURGICAL LIGHT HANDLE (MISCELLANEOUS) ×2 IMPLANT
CUFF TOURNIQUET SINGLE 18IN (TOURNIQUET CUFF) ×2 IMPLANT
CUFF TOURNIQUET SINGLE 24IN (TOURNIQUET CUFF) IMPLANT
CUFF TOURNIQUET SINGLE 34IN LL (TOURNIQUET CUFF) IMPLANT
CUFF TOURNIQUET SINGLE 44IN (TOURNIQUET CUFF) IMPLANT
DRAPE ORTHO SPLIT 77X108 STRL (DRAPES) ×2
DRAPE SURG 17X23 STRL (DRAPES) ×2 IMPLANT
DRAPE SURG ORHT 6 SPLT 77X108 (DRAPES) ×2 IMPLANT
DRAPE U-SHAPE 47X51 STRL (DRAPES) ×2 IMPLANT
DRSG VAC ATS MED SENSATRAC (GAUZE/BANDAGES/DRESSINGS) ×2 IMPLANT
DURAPREP 26ML APPLICATOR (WOUND CARE) ×2 IMPLANT
ELECT CAUTERY BLADE 6.4 (BLADE) IMPLANT
ELECT REM PT RETURN 9FT ADLT (ELECTROSURGICAL)
ELECTRODE REM PT RTRN 9FT ADLT (ELECTROSURGICAL) IMPLANT
GAUZE XEROFORM 1X8 LF (GAUZE/BANDAGES/DRESSINGS) ×2 IMPLANT
GLOVE BIOGEL PI IND STRL 8 (GLOVE) ×2 IMPLANT
GLOVE BIOGEL PI INDICATOR 8 (GLOVE) ×2
GLOVE ORTHO TXT STRL SZ7.5 (GLOVE) ×2 IMPLANT
GOWN PREVENTION PLUS LG XLONG (DISPOSABLE) IMPLANT
GOWN PREVENTION PLUS XLARGE (GOWN DISPOSABLE) ×2 IMPLANT
GOWN STRL NON-REIN LRG LVL3 (GOWN DISPOSABLE) ×2 IMPLANT
HANDPIECE INTERPULSE COAX TIP (DISPOSABLE)
KIT BASIN OR (CUSTOM PROCEDURE TRAY) ×2 IMPLANT
KIT ROOM TURNOVER OR (KITS) ×2 IMPLANT
MANIFOLD NEPTUNE II (INSTRUMENTS) ×2 IMPLANT
NS IRRIG 1000ML POUR BTL (IV SOLUTION) ×2 IMPLANT
PACK ORTHO EXTREMITY (CUSTOM PROCEDURE TRAY) ×2 IMPLANT
PAD ARMBOARD 7.5X6 YLW CONV (MISCELLANEOUS) ×4 IMPLANT
PADDING CAST ABS 4INX4YD NS (CAST SUPPLIES) ×2
PADDING CAST ABS COTTON 4X4 ST (CAST SUPPLIES) ×2 IMPLANT
PADDING CAST COTTON 6X4 STRL (CAST SUPPLIES) ×2 IMPLANT
SET HNDPC FAN SPRY TIP SCT (DISPOSABLE) IMPLANT
SPONGE GAUZE 4X4 12PLY (GAUZE/BANDAGES/DRESSINGS) ×2 IMPLANT
SPONGE LAP 18X18 X RAY DECT (DISPOSABLE) ×2 IMPLANT
STOCKINETTE IMPERVIOUS 9X36 MD (GAUZE/BANDAGES/DRESSINGS) ×2 IMPLANT
SUT ETHILON 2 0 FS 18 (SUTURE) ×6 IMPLANT
SUT ETHILON 3 0 PS 1 (SUTURE) ×4 IMPLANT
SYR CONTROL 10ML LL (SYRINGE) IMPLANT
TOWEL OR 17X24 6PK STRL BLUE (TOWEL DISPOSABLE) ×2 IMPLANT
TOWEL OR 17X26 10 PK STRL BLUE (TOWEL DISPOSABLE) ×2 IMPLANT
TUBE ANAEROBIC SPECIMEN COL (MISCELLANEOUS) IMPLANT
TUBE CONNECTING 12X1/4 (SUCTIONS) ×2 IMPLANT
TUBE FEEDING 5FR 15 INCH (TUBING) IMPLANT
UNDERPAD 30X30 INCONTINENT (UNDERPADS AND DIAPERS) ×2 IMPLANT
WATER STERILE IRR 1000ML POUR (IV SOLUTION) ×2 IMPLANT
YANKAUER SUCT BULB TIP NO VENT (SUCTIONS) ×2 IMPLANT

## 2011-10-19 NOTE — Brief Op Note (Signed)
10/18/2011 - 10/19/2011  7:09 PM  PATIENT:  [name] Ball  36 y.o. female  PRE-OPERATIVE DIAGNOSIS:  Infected right elbow' soft-tissue wound, post I&D X 1  POST-OPERATIVE DIAGNOSIS:  same  PROCEDURE:  Procedure(s) (LRB) with comments: IRRIGATION AND DEBRIDEMENT EXTREMITY (Right) - Right elbow repeat irrigation and debridement, Vac change  SURGEON:  Surgeon(s) and Role:    * Kathryne Hitch, MD - Primary  PHYSICIAN ASSISTANT:   ASSISTANTS: none   ANESTHESIA:   general  EBL:   <100 cc  BLOOD ADMINISTERED:none  DRAINS: vac  LOCAL MEDICATIONS USED:  NONE  SPECIMEN:  No Specimen  DISPOSITION OF SPECIMEN:  N/A  COUNTS:  YES  TOURNIQUET:  * Missing tourniquet times found for documented tourniquets in log:  [id number] *  DICTATION: .Other Dictation: Dictation Number [id number]  PLAN OF CARE: Admit to inpatient   PATIENT DISPOSITION:  PACU - hemodynamically stable.   Delay start of Pharmacological VTE agent (>24hrs) due to surgical blood loss or risk of bleeding: no

## 2011-10-19 NOTE — OR Nursing (Signed)
Late entry charting 10/19/2011 1934

## 2011-10-19 NOTE — Anesthesia Procedure Notes (Signed)
Procedure Name: LMA Insertion Date/Time: 10/19/2011 6:16 PM Performed by: Molli Hazard Pre-anesthesia Checklist: Patient identified, Emergency Drugs available, Suction available and Patient being monitored Patient Re-evaluated:Patient Re-evaluated prior to inductionOxygen Delivery Method: Circle system utilized Preoxygenation: Pre-oxygenation with 100% oxygen Intubation Type: IV induction Ventilation: Mask ventilation without difficulty LMA Size: 4.0 Number of attempts: 1 Placement Confirmation: positive ETCO2 Tube secured with: Tape Dental Injury: Teeth and Oropharynx as per pre-operative assessment

## 2011-10-19 NOTE — Progress Notes (Signed)
Patient ID: [name] Ball, female   DOB: Jul 03, 1975, 36 y.o.   MRN: [id number] Although her WBC has decreased, she is spiking temps and has considerable pain.  Given the extent of the infection I found, this does warrant a repeat irrigation and debridement this afternoon.  i will be able to assess her progress and reassess the soft tissues.  She will certainly need multiple surgeries on this right elbow to clear the infection and eventually cover the open wound (i.e. skin grafting).

## 2011-10-19 NOTE — Progress Notes (Signed)
Orthopedic Tech Progress Note Patient Details:  [name] Ball 1975/07/08 [id number] Mission sling applied to Right UE per nursing orders. Nurse to maintain sling/care. Ortho Devices Type of Ortho Device: Other (comment) (Mission Sling) Ortho Device/Splint Location: Applied to Right UE Ortho Device/Splint Interventions: Application   [name] Ball 10/19/2011, 9:03 AM

## 2011-10-19 NOTE — Transfer of Care (Signed)
Immediate Anesthesia Transfer of Care Note  Patient: [name] Ball  Procedure(s) Performed: Procedure(s) (LRB) with comments: IRRIGATION AND DEBRIDEMENT EXTREMITY (Right) - Right elbow repeat irrigation and debridement, Vac change  Patient Location: PACU  Anesthesia Type: General  Level of Consciousness: awake, alert  and oriented  Airway & Oxygen Therapy: Patient Spontanous Breathing and Patient connected to nasal cannula oxygen  Post-op Assessment: Report given to PACU RN, Post -op Vital signs reviewed and stable and Patient moving all extremities X 4  Post vital signs: Reviewed and stable  Complications: No apparent anesthesia complications
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
## 2011-10-19 NOTE — Progress Notes (Signed)
Subjective: 1 Day Post-Op Procedure(s) (LRB): IRRIGATION AND DEBRIDEMENT EXTREMITY (Right) Patient reports pain as moderate.  Has been running high fevers.  Objective: Vital signs in last 24 hours: Temp:  [97.5 F (36.4 C)-102.9 F (39.4 C)] 102.9 F (39.4 C) (09/10 0645) Pulse Rate:  [77-104] 104  (09/10 0645) Resp:  [11-20] 16  (09/10 0645) BP: (90-123)/(56-73) 112/56 mmHg (09/10 0645) SpO2:  [90 %-100 %] 90 % (09/10 0645) FiO2 (%):  [28 %] 28 % (09/09 2119) Weight:  [95.255 kg (210 lb)] 95.255 kg (210 lb) (09/09 2133)  Intake/Output from previous day: 09/09 0701 - 09/10 0700 In: 1400 [I.V.:1000; IV Piggyback:400] Out: 0  Intake/Output this shift:     Basename 10/18/11 1430  HGB 13.0    Basename 10/18/11 1430  WBC 15.0*  RBC 4.35  HCT 38.3  PLT 225    Basename 10/18/11 1430  NA 134*  K 3.5  CL 96  CO2 27  BUN 7  CREATININE 0.69  GLUCOSE 100*  CALCIUM 9.5   No results found for this basename: LABPT:2,INR:2 in the last 72 hours  Sensation intact distally Intact pulses distally Considerable swelling and pain, compartments soft.  Assessment/Plan: 1 Day Post-Op Procedure(s) (LRB): IRRIGATION AND DEBRIDEMENT EXTREMITY (Right) Continue IV antibiotics NPO for possible repeat I&D today late afternoon.  BLACKMAN,CHRISTOPHER Y 10/19/2011, 7:03 AM

## 2011-10-19 NOTE — Preoperative (Signed)
Beta Blockers   Reason not to administer Beta Blockers:Not Applicable 

## 2011-10-19 NOTE — Anesthesia Postprocedure Evaluation (Signed)
  Anesthesia Post-op Note  Patient: [name] Ball  Procedure(s) Performed: Procedure(s) (LRB) with comments: IRRIGATION AND DEBRIDEMENT EXTREMITY (Right) - Right elbow repeat irrigation and debridement, Vac change  Patient Location: PACU  Anesthesia Type: General  Level of Consciousness: awake and alert   Airway and Oxygen Therapy: Patient Spontanous Breathing and Patient connected to nasal cannula oxygen  Post-op Pain: moderate  Post-op Assessment: Post-op Vital signs reviewed, Patient's Cardiovascular Status Stable, Respiratory Function Stable, Patent Airway and No signs of Nausea or vomiting  Post-op Vital Signs: Reviewed and stable  Complications: No apparent anesthesia complications

## 2011-10-19 NOTE — Anesthesia Preprocedure Evaluation (Addendum)
Anesthesia Evaluation  Patient identified by MRN, date of birth, ID band Patient awake    Reviewed: Allergy & Precautions, H&P , NPO status , Patient's Chart, lab work & pertinent test results  History of Anesthesia Complications Negative for: history of anesthetic complications  Airway Mallampati: II TM Distance: >3 FB Neck ROM: full    Dental No notable dental hx. (+) Teeth Intact and Dental Advisory Given   Pulmonary neg pulmonary ROS,  breath sounds clear to auscultation  Pulmonary exam normal       Cardiovascular negative cardio ROS  Rhythm:Regular Rate:Normal     Neuro/Psych negative neurological ROS     GI/Hepatic negative GI ROS, Neg liver ROS,   Endo/Other    Renal/GU negative Renal ROS     Musculoskeletal   Abdominal (+) + obese,   Peds  Hematology   Anesthesia Other Findings   Reproductive/Obstetrics S/p hysterectomy                          Anesthesia Physical Anesthesia Plan  ASA: I  Anesthesia Plan: General   Post-op Pain Management:    Induction: Intravenous  Airway Management Planned: LMA  Additional Equipment:   Intra-op Plan:   Post-operative Plan:   Informed Consent: I have reviewed the patients History and Physical, chart, labs and discussed the procedure including the risks, benefits and alternatives for the proposed anesthesia with the patient or authorized representative who has indicated his/her understanding and acceptance.   Dental advisory given  Plan Discussed with: CRNA and Surgeon  Anesthesia Plan Comments: (Plan routine monitors, GA- LMA OK)       Anesthesia Quick Evaluation

## 2011-10-19 NOTE — Progress Notes (Signed)
Utilization review completed. Paola Aleshire, RN, BSN. 

## 2011-10-20 LAB — BASIC METABOLIC PANEL
BUN: 9 mg/dL (ref 6–23)
Calcium: 8.6 mg/dL (ref 8.4–10.5)
GFR calc Af Amer: 64 mL/min — ABNORMAL LOW (ref >90)
GFR calc non Af Amer: 55 mL/min — ABNORMAL LOW (ref >90)
Glucose, Bld: 99 mg/dL (ref 70–99)
Sodium: 140 mEq/L (ref 135–145)

## 2011-10-20 MED ORDER — POTASSIUM CHLORIDE CRYS ER 20 MEQ PO TBCR
40.0000 meq | EXTENDED_RELEASE_TABLET | Freq: Once | ORAL | Status: AC
  Administered 2011-10-20: 40 meq via ORAL
  Filled 2011-10-20: qty 2

## 2011-10-20 NOTE — Progress Notes (Signed)
Subjective: 1 Day Post-Op Procedure(s) (LRB): IRRIGATION AND DEBRIDEMENT EXTREMITY (Right) Patient reports pain as moderate.  Less than yesterday.  Ok since second surgery last evening. Objective: Vital signs in last 24 hours: Temp:  [97.6 F (36.4 C)-100 F (37.8 C)] 99.4 F (37.4 C) (09/11 0639) Pulse Rate:  [86-103] 91  (09/11 0639) Resp:  [12-16] 16  (09/11 0639) BP: (102-133)/(56-88) 102/62 mmHg (09/11 0639) SpO2:  [95 %-100 %] 95 % (09/11 0639)  Intake/Output from previous day: 09/10 0701 - 09/11 0700 In: 1200 [I.V.:1200] Out: 30 [Drains:30] Intake/Output this shift: Total I/O In: 1200 [I.V.:1200] Out: 30 [Drains:30]   Basename 10/19/11 0650 10/18/11 1430  HGB 10.8* 13.0    Basename 10/19/11 0650 10/18/11 1430  WBC 9.4 15.0*  RBC 3.69* 4.35  HCT 32.4* 38.3  PLT 233 225    Basename 10/19/11 0650 10/18/11 1430  NA 133* 134*  K 3.3* 3.5  CL 96 96  CO2 28 27  BUN 6 7  CREATININE 0.68 0.69  GLUCOSE 130* 100*  CALCIUM 8.6 9.5   No results found for this basename: LABPT:2,INR:2 in the last 72 hours  Sensation intact distally Intact pulses distally No cellulitis present Compartment soft Vac with good seal  Assessment/Plan: 1 Day Post-Op Procedure(s) (LRB): IRRIGATION AND DEBRIDEMENT EXTREMITY (Right) Continue zosyn until organism isolated (gram ne rods thus far) Continue vac for next 3 days prior to return to the OR for soft-tissue coverage either late Friday vs Saturday.  [name] Ball 10/20/2011, 6:53 AM
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
## 2011-10-20 NOTE — Progress Notes (Signed)
Utilization review completed. Zera Markwardt, RN, BSN. 

## 2011-10-21 ENCOUNTER — Encounter (HOSPITAL_COMMUNITY): Payer: Self-pay | Admitting: Orthopaedic Surgery

## 2011-10-21 LAB — CBC
HCT: 31.1 % — ABNORMAL LOW (ref 36.0–46.0)
MCV: 89.4 fL (ref 78.0–100.0)
Platelets: 266 10*3/uL (ref 150–400)
RBC: 3.48 MIL/uL — ABNORMAL LOW (ref 3.87–5.11)
WBC: 6.8 10*3/uL (ref 4.0–10.5)

## 2011-10-21 LAB — BASIC METABOLIC PANEL
CO2: 26 mEq/L (ref 19–32)
Calcium: 8.8 mg/dL (ref 8.4–10.5)
Chloride: 103 mEq/L (ref 96–112)
Sodium: 141 mEq/L (ref 135–145)

## 2011-10-21 NOTE — Progress Notes (Signed)
ANTIBIOTIC CONSULT NOTE - FOLLOW UP  Pharmacy Consult for zosyn Indication: right elbow infection   No Known Allergies  Patient Measurements: Height: 5\' 6"  (167.6 cm) Weight: 210 lb (95.255 kg) IBW/kg (Calculated) : 59.3    Vital Signs: Temp: 98.5 F (36.9 C) (09/12 0623) BP: 100/53 mmHg (09/12 0623) Pulse Rate: 88  (09/12 0623) Intake/Output from previous day: 09/11 0701 - 09/12 0700 In: 1920 [P.O.:1320; I.V.:600] Out: 30 [Drains:30] Intake/Output from this shift:    Labs:  Basename 10/21/11 0525 10/20/11 0645 10/19/11 0650 10/18/11 1430  WBC 6.8 -- 9.4 15.0*  HGB 10.2* -- 10.8* 13.0  PLT 266 -- 233 225  LABCREA -- -- -- --  CREATININE 1.56* 1.25* 0.68 --   Estimated Creatinine Clearance: 59.1 ml/min (by C-G formula based on Cr of 1.56).   Assessment: 36 yo female w/ a right elbow infection on day 3 of zosyn and cultures are growing GNR. SCr is trending up with CrCl~ 60.  Clinda 9/9 x 1 Vanc 9/9 >>9/11 Zosyn 9/9 >>  9/9 Wound cx - GNR  Plan:  -No dose changes needed -Will follow cultures and renal function  Harland German, Pharm D 10/21/2011 8:16 AM

## 2011-10-22 LAB — BASIC METABOLIC PANEL
BUN: 9 mg/dL (ref 6–23)
CO2: 29 mEq/L (ref 19–32)
GFR calc non Af Amer: 37 mL/min — ABNORMAL LOW (ref >90)
Glucose, Bld: 98 mg/dL (ref 70–99)
Potassium: 4 mEq/L (ref 3.5–5.1)

## 2011-10-22 LAB — WOUND CULTURE

## 2011-10-22 LAB — CBC
HCT: 31.3 % — ABNORMAL LOW (ref 36.0–46.0)
Hemoglobin: 10.3 g/dL — ABNORMAL LOW (ref 12.0–15.0)
MCHC: 32.9 g/dL (ref 30.0–36.0)

## 2011-10-22 MED ORDER — LORAZEPAM 0.5 MG PO TABS
0.5000 mg | ORAL_TABLET | Freq: Four times a day (QID) | ORAL | Status: DC | PRN
  Administered 2011-10-22 (×2): 0.5 mg via ORAL
  Filled 2011-10-22 (×2): qty 1

## 2011-10-22 MED ORDER — CIPROFLOXACIN HCL 500 MG PO TABS
500.0000 mg | ORAL_TABLET | Freq: Two times a day (BID) | ORAL | Status: DC
  Administered 2011-10-22 – 2011-10-26 (×9): 500 mg via ORAL
  Filled 2011-10-22 (×12): qty 1

## 2011-10-22 NOTE — Progress Notes (Signed)
Pharmacy note: Cipro  36 yo female with right elbow skin and soft-tissue infection on zosyn and cultures show E. Coli and K. Oxytoca both with sensitivity to cipro  -SCr=1.74, CrCl~50  Plan -Spoke with Dr. Magnus Ivan, will change to cipro po -The CrCl cutoff for once daily cipro is 30 ml/min which for this patient is a SCr of about 2.8. -Will give cipro 500mg  po bid (If SCr goes above 2.5 or so would consider a change to once daily).  Thank you, Harland German, Pharm D 10/22/2011 1:04 PM

## 2011-10-22 NOTE — Progress Notes (Signed)
Patient ID: [name] Ball, female   DOB: [birth year], 36 y.o.   MRN: [id number] Doing well overall.  Right arm swelling much less.  Is clearing her infection nicely.  Vac on right arm with good seal.  Will proceed to the OR tomorrow for STSG to right arm wound.

## 2011-10-22 NOTE — Progress Notes (Signed)
Utilization review completed. Antonia Jicha, RN, BSN. 

## 2011-10-23 ENCOUNTER — Encounter (HOSPITAL_COMMUNITY)
Admission: AD | Disposition: A | Discharge status: Home / Self Care | Payer: Self-pay | Source: Ambulatory Visit | Attending: Orthopaedic Surgery

## 2011-10-23 ENCOUNTER — Inpatient Hospital Stay (HOSPITAL_COMMUNITY): Payer: Managed Care, Other (non HMO) | Admitting: *Deleted

## 2011-10-23 ENCOUNTER — Encounter (HOSPITAL_COMMUNITY): Payer: Self-pay | Admitting: *Deleted

## 2011-10-23 HISTORY — PX: SKIN SPLIT GRAFT: SHX444

## 2011-10-23 HISTORY — PX: I&D EXTREMITY: SHX5045

## 2011-10-23 LAB — CBC
HCT: 32.7 % — ABNORMAL LOW (ref 36.0–46.0)
Hemoglobin: 10.4 g/dL — ABNORMAL LOW (ref 12.0–15.0)
RBC: 3.58 MIL/uL — ABNORMAL LOW (ref 3.87–5.11)
RDW: 13.2 % (ref 11.5–15.5)
WBC: 6.4 10*3/uL (ref 4.0–10.5)

## 2011-10-23 LAB — BASIC METABOLIC PANEL WITH GFR
BUN: 12 mg/dL (ref 6–23)
CO2: 28 meq/L (ref 19–32)
Calcium: 9.5 mg/dL (ref 8.4–10.5)
Chloride: 101 meq/L (ref 96–112)
Creatinine, Ser: 1.6 mg/dL — ABNORMAL HIGH (ref 0.50–1.10)
GFR calc Af Amer: 48 mL/min — ABNORMAL LOW
GFR calc non Af Amer: 41 mL/min — ABNORMAL LOW
Glucose, Bld: 91 mg/dL (ref 70–99)
Potassium: 3.6 meq/L (ref 3.5–5.1)
Sodium: 141 meq/L (ref 135–145)

## 2011-10-23 LAB — ANAEROBIC CULTURE

## 2011-10-23 SURGERY — APPLICATION, GRAFT, SKIN, SPLIT-THICKNESS
Anesthesia: General | Site: Elbow | Laterality: Right | Wound class: Clean

## 2011-10-23 MED ORDER — GLUCAGON HCL (RDNA) 1 MG IJ SOLR
INTRAMUSCULAR | Status: AC
  Filled 2011-10-23: qty 1

## 2011-10-23 MED ORDER — HYDROMORPHONE HCL PF 1 MG/ML IJ SOLN
0.2500 mg | INTRAMUSCULAR | Status: DC | PRN
  Administered 2011-10-23 (×3): 0.5 mg via INTRAVENOUS

## 2011-10-23 MED ORDER — HYDROMORPHONE HCL PF 1 MG/ML IJ SOLN
INTRAMUSCULAR | Status: AC
  Filled 2011-10-23: qty 2

## 2011-10-23 MED ORDER — LIDOCAINE HCL (CARDIAC) 20 MG/ML IV SOLN
INTRAVENOUS | Status: DC | PRN
  Administered 2011-10-23: 50 mg via INTRAVENOUS

## 2011-10-23 MED ORDER — OXYCODONE HCL 5 MG/5ML PO SOLN: 5.0000 mg | Freq: Once | ORAL | Status: AC | PRN

## 2011-10-23 MED ORDER — DEXTROSE 5 % IV SOLN
1.5000 g | INTRAVENOUS | Status: DC | PRN
  Administered 2011-10-23: 1.5 g via INTRAVENOUS

## 2011-10-23 MED ORDER — SODIUM CHLORIDE 0.9 % IR SOLN
Status: DC | PRN
  Administered 2011-10-23: 3000 mL

## 2011-10-23 MED ORDER — ONDANSETRON HCL 4 MG/2ML IJ SOLN
INTRAMUSCULAR | Status: DC | PRN
  Administered 2011-10-23: 4 mg via INTRAVENOUS

## 2011-10-23 MED ORDER — MIDAZOLAM HCL 5 MG/5ML IJ SOLN
INTRAMUSCULAR | Status: DC | PRN
  Administered 2011-10-23: 2 mg via INTRAVENOUS

## 2011-10-23 MED ORDER — HYDROMORPHONE HCL PF 1 MG/ML IJ SOLN
INTRAMUSCULAR | Status: AC
  Filled 2011-10-23: qty 1

## 2011-10-23 MED ORDER — SODIUM CHLORIDE 0.9 % IJ SOLN
INTRAMUSCULAR | Status: DC | PRN
  Administered 2011-10-23: 09:00:00

## 2011-10-23 MED ORDER — PROPOFOL 10 MG/ML IV BOLUS
INTRAVENOUS | Status: DC | PRN
  Administered 2011-10-23: 200 mg via INTRAVENOUS

## 2011-10-23 MED ORDER — LACTATED RINGERS IV SOLN
INTRAVENOUS | Status: DC | PRN
  Administered 2011-10-23 (×2): via INTRAVENOUS

## 2011-10-23 MED ORDER — MINERAL OIL LIGHT 100 % EX OIL
TOPICAL_OIL | CUTANEOUS | Status: AC
  Filled 2011-10-23: qty 25

## 2011-10-23 MED ORDER — HYDROMORPHONE HCL PF 1 MG/ML IJ SOLN: 0.2500 mg | INTRAMUSCULAR | Status: DC | PRN

## 2011-10-23 MED ORDER — EPINEPHRINE HCL 1 MG/ML IJ SOLN
INTRAMUSCULAR | Status: AC
  Filled 2011-10-23: qty 1

## 2011-10-23 MED ORDER — FENTANYL CITRATE 0.05 MG/ML IJ SOLN
INTRAMUSCULAR | Status: DC | PRN
  Administered 2011-10-23 (×3): 50 ug via INTRAVENOUS
  Administered 2011-10-23: 100 ug via INTRAVENOUS

## 2011-10-23 MED ORDER — HYDROMORPHONE HCL PF 1 MG/ML IJ SOLN
0.5000 mg | INTRAMUSCULAR | Status: AC | PRN
  Administered 2011-10-23 (×4): 0.5 mg via INTRAVENOUS

## 2011-10-23 MED ORDER — OXYCODONE HCL 5 MG PO TABS: 5.0000 mg | ORAL_TABLET | Freq: Once | ORAL | Status: AC | PRN

## 2011-10-23 MED ORDER — HYDROMORPHONE HCL PF 1 MG/ML IJ SOLN
0.2500 mg | INTRAMUSCULAR | Status: DC | PRN
  Administered 2011-10-23: 0.5 mg via INTRAVENOUS

## 2011-10-23 SURGICAL SUPPLY — 47 items
BANDAGE ELASTIC 4 VELCRO ST LF (GAUZE/BANDAGES/DRESSINGS) ×2 IMPLANT
BANDAGE ELASTIC 6 VELCRO ST LF (GAUZE/BANDAGES/DRESSINGS) ×2 IMPLANT
BANDAGE GAUZE ELAST BULKY 4 IN (GAUZE/BANDAGES/DRESSINGS) ×2 IMPLANT
BLADE DERMATOME SS (BLADE) ×4 IMPLANT
BLADE SURG ROTATE 9660 (MISCELLANEOUS) IMPLANT
CANISTER SUCTION 2500CC (MISCELLANEOUS) IMPLANT
CANISTER WOUND CARE 500ML ATS (WOUND CARE) ×2 IMPLANT
CLOTH BEACON ORANGE TIMEOUT ST (SAFETY) ×2 IMPLANT
COVER SURGICAL LIGHT HANDLE (MISCELLANEOUS) ×2 IMPLANT
DEPRESSOR TONGUE BLADE STERILE (MISCELLANEOUS) ×4 IMPLANT
DERMACARRIERS GRAFT 1 TO 1.5 (DISPOSABLE)
DRAPE ORTHO SPLIT 77X108 STRL (DRAPES) ×2
DRAPE SURG ORHT 6 SPLT 77X108 (DRAPES) ×2 IMPLANT
DRSG ADAPTIC 3X8 NADH LF (GAUZE/BANDAGES/DRESSINGS) ×2 IMPLANT
DRSG PAD ABDOMINAL 8X10 ST (GAUZE/BANDAGES/DRESSINGS) ×2 IMPLANT
DRSG VAC ATS MED SENSATRAC (GAUZE/BANDAGES/DRESSINGS) ×2 IMPLANT
ELECT REM PT RETURN 9FT ADLT (ELECTROSURGICAL)
ELECTRODE REM PT RTRN 9FT ADLT (ELECTROSURGICAL) IMPLANT
GAUZE XEROFORM 5X9 LF (GAUZE/BANDAGES/DRESSINGS) ×2 IMPLANT
GLOVE BIOGEL PI IND STRL 8 (GLOVE) ×1 IMPLANT
GLOVE BIOGEL PI INDICATOR 8 (GLOVE) ×1
GLOVE ORTHO TXT STRL SZ7.5 (GLOVE) ×2 IMPLANT
GOWN PREVENTION PLUS LG XLONG (DISPOSABLE) IMPLANT
GOWN PREVENTION PLUS XLARGE (GOWN DISPOSABLE) ×2 IMPLANT
GOWN STRL NON-REIN LRG LVL3 (GOWN DISPOSABLE) ×2 IMPLANT
GRAFT DERMACARRIERS 1 TO 1.5 (DISPOSABLE) IMPLANT
HANDPIECE INTERPULSE COAX TIP (DISPOSABLE)
KIT BASIN OR (CUSTOM PROCEDURE TRAY) ×2 IMPLANT
KIT ROOM TURNOVER OR (KITS) ×2 IMPLANT
MANIFOLD NEPTUNE II (INSTRUMENTS) IMPLANT
MARKER SKIN DUAL TIP RULER LAB (MISCELLANEOUS) ×2 IMPLANT
MATRIX SURGICAL PSM 7X10CM (Tissue) ×2 IMPLANT
NS IRRIG 1000ML POUR BTL (IV SOLUTION) ×2 IMPLANT
PACK ORTHO EXTREMITY (CUSTOM PROCEDURE TRAY) ×2 IMPLANT
PAD ARMBOARD 7.5X6 YLW CONV (MISCELLANEOUS) ×4 IMPLANT
PADDING CAST COTTON 6X4 STRL (CAST SUPPLIES) IMPLANT
SET HNDPC FAN SPRY TIP SCT (DISPOSABLE) IMPLANT
SPONGE GAUZE 4X4 12PLY (GAUZE/BANDAGES/DRESSINGS) IMPLANT
SPONGE LAP 18X18 X RAY DECT (DISPOSABLE) ×6 IMPLANT
STAPLER VISISTAT 35W (STAPLE) ×2 IMPLANT
SUT VIC AB 4-0 RB1 18 (SUTURE) ×2 IMPLANT
TOWEL OR 17X24 6PK STRL BLUE (TOWEL DISPOSABLE) ×2 IMPLANT
TOWEL OR 17X26 10 PK STRL BLUE (TOWEL DISPOSABLE) ×2 IMPLANT
TUBE CONNECTING 12X1/4 (SUCTIONS) IMPLANT
UNDERPAD 30X30 INCONTINENT (UNDERPADS AND DIAPERS) ×2 IMPLANT
WATER STERILE IRR 1000ML POUR (IV SOLUTION) ×2 IMPLANT
YANKAUER SUCT BULB TIP NO VENT (SUCTIONS) IMPLANT

## 2011-10-23 NOTE — Anesthesia Preprocedure Evaluation (Addendum)
Anesthesia Evaluation  Patient identified by MRN, date of birth, ID band Patient awake    Reviewed: Allergy & Precautions, H&P , NPO status , Patient's Chart, lab work & pertinent test results  Airway Mallampati: II  Neck ROM: Full    Dental No notable dental hx. (+) Teeth Intact and Dental Advisory Given   Pulmonary neg pulmonary ROS,  breath sounds clear to auscultation  Pulmonary exam normal       Cardiovascular negative cardio ROS  Rhythm:Regular Rate:Normal     Neuro/Psych negative neurological ROS  negative psych ROS   GI/Hepatic negative GI ROS, Neg liver ROS,   Endo/Other  negative endocrine ROS  Renal/GU negative Renal ROS  negative genitourinary   Musculoskeletal negative musculoskeletal ROS (+)   Abdominal   Peds  Hematology negative hematology ROS (+)   Anesthesia Other Findings   Reproductive/Obstetrics negative OB ROS                         Anesthesia Physical Anesthesia Plan  ASA: II  Anesthesia Plan: General   Post-op Pain Management:    Induction: Intravenous  Airway Management Planned: Oral ETT and LMA  Additional Equipment:   Intra-op Plan:   Post-operative Plan: Extubation in OR  Informed Consent: I have reviewed the patients History and Physical, chart, labs and discussed the procedure including the risks, benefits and alternatives for the proposed anesthesia with the patient or authorized representative who has indicated his/her understanding and acceptance.   Dental advisory given  Plan Discussed with: CRNA and Surgeon  Anesthesia Plan Comments:        Anesthesia Quick Evaluation

## 2011-10-23 NOTE — H&P (Signed)
  We are returning to the OR today for a repeat irrigation and debridement of her right arm and, if her wound looks good, we plan to proceed with a split-thickness skin graft for coverage.

## 2011-10-23 NOTE — Anesthesia Postprocedure Evaluation (Signed)
  Anesthesia Post-op Note  Patient: Anele Pinks Mccurley  Procedure(s) Performed: Procedure(s) (LRB) with comments: SKIN GRAFT SPLIT THICKNESS (Right) - Split thickness skin graft to right elbow from right thigh; 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 IRRIGATION AND DEBRIDEMENT EXTREMITY (Right)  Patient Location: PACU  Anesthesia Type: General  Level of Consciousness: awake and alert   Airway and Oxygen Therapy: Patient Spontanous Breathing and Patient connected to nasal cannula oxygen  Post-op Pain: moderate  Post-op Assessment: Post-op Vital signs reviewed, Patient's Cardiovascular Status Stable, Respiratory Function Stable, Patent Airway and No signs of Nausea or vomiting  Post-op Vital Signs: Reviewed and stable  Complications: No apparent anesthesia complications

## 2011-10-23 NOTE — Transfer of Care (Signed)
Immediate Anesthesia Transfer of Care Note  Patient: [name] Ball  Procedure(s) Performed: Procedure(s) (LRB) with comments: SKIN GRAFT SPLIT THICKNESS (Right) - Split thickness skin graft to right elbow from right thigh  Patient Location: PACU  Anesthesia Type: General  Level of Consciousness: awake, alert  and oriented  Airway & Oxygen Therapy: Patient Spontanous Breathing and Patient connected to nasal cannula oxygen  Post-op Assessment: Report given to PACU RN and Post -op Vital signs reviewed and stable  Post vital signs: Reviewed and stable  Complications: No apparent anesthesia complications

## 2011-10-23 NOTE — Preoperative (Signed)
Beta Blockers   Reason not to administer Beta Blockers:Not Applicable 

## 2011-10-23 NOTE — Anesthesia Procedure Notes (Signed)
Procedure Name: LMA Insertion Date/Time: 10/23/2011 7:45 AM Performed by: Jakyle Petrucelli S Pre-anesthesia Checklist: Patient identified, Emergency Drugs available, Suction available, Patient being monitored and Timeout performed Patient Re-evaluated:Patient Re-evaluated prior to inductionOxygen Delivery Method: Circle system utilized Preoxygenation: Pre-oxygenation with 100% oxygen Intubation Type: IV induction Ventilation: Mask ventilation without difficulty LMA: LMA inserted LMA Size: 4.0 Tube type: Oral Number of attempts: 1 Placement Confirmation: positive ETCO2 and breath sounds checked- equal and bilateral Tube secured with: Tape Dental Injury: Teeth and Oropharynx as per pre-operative assessment

## 2011-10-23 NOTE — Brief Op Note (Signed)
10/18/2011 - 10/23/2011  9:26 AM  PATIENT:  [name] Ball  36 y.o. female  PRE-OPERATIVE DIAGNOSIS:  Right elbow wound  POST-OPERATIVE DIAGNOSIS:  right elbow wound   PROCEDURE:  Procedure(s) (LRB) with comments: SKIN GRAFT SPLIT THICKNESS (Right) - Split thickness skin graft to right elbow from right thigh Placement of ACELL Xenograft over exposed olecranon Placement of Vac sponge over STSG  SURGEON:  Surgeon(s) and Role:    * Kathryne Hitch, MD - Primary  PHYSICIAN ASSISTANT:   ASSISTANTS: none   ANESTHESIA:   general  EBL:  Total I/O In: 1500 [I.V.:1500] Out: -   BLOOD ADMINISTERED:none  DRAINS: none   LOCAL MEDICATIONS USED:  NONE  SPECIMEN:  No Specimen  DISPOSITION OF SPECIMEN:  N/A  COUNTS:  YES  TOURNIQUET:  * Missing tourniquet times found for documented tourniquets in log:  [id number] *  DICTATION: .Other Dictation: Dictation Number [id number]  PLAN OF CARE: Admit to inpatient   PATIENT DISPOSITION:  PACU - hemodynamically stable.   Delay start of Pharmacological VTE agent (>24hrs) due to surgical blood loss or risk of bleeding: no

## 2011-10-24 LAB — BASIC METABOLIC PANEL
BUN: 9 mg/dL (ref 6–23)
Chloride: 103 mEq/L (ref 96–112)
GFR calc Af Amer: 47 mL/min — ABNORMAL LOW (ref >90)
Potassium: 4.3 mEq/L (ref 3.5–5.1)
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
## 2011-10-24 NOTE — Progress Notes (Signed)
Pt has not had ANY measurable output from the woundvac since placement in surgery. Suction has been maintained at 75 mmHg consistently.

## 2011-10-24 NOTE — Progress Notes (Signed)
Subjective: Pt stable - pain controlled   Objective: Vital signs in last 24 hours: Temp:  [97.9 F (36.6 C)-99.3 F (37.4 C)] 98.8 F (37.1 C) (09/15 0644) Pulse Rate:  [60-96] 74  (09/15 0644) Resp:  [11-19] 16  (09/15 0644) BP: (110-130)/(57-95) 130/80 mmHg (09/15 0644) SpO2:  [92 %-100 %] 96 % (09/15 0644)  Intake/Output from previous day: 09/14 0701 - 09/15 0700 In: 2230 [P.O.:480; I.V.:1750] Out: 50 [Blood:50] Intake/Output this shift:    Exam:  Neurovascular intact Sensation intact distally Intact pulses distally  Labs:  Basename 10/23/11 0610 10/22/11 1042  HGB 10.4* 10.3*    Basename 10/23/11 0610 10/22/11 1042  WBC 6.4 7.7  RBC 3.58* 3.48*  HCT 32.7* 31.3*  PLT 390 342    Basename 10/24/11 0430 10/23/11 0610  NA 141 141  K 4.3 3.6  CL 103 101  CO2 32 28  BUN 9 12  CREATININE 1.63* 1.60*  GLUCOSE 97 91  CALCIUM 9.2 9.5   No results found for this basename: LABPT:2,INR:2 in the last 72 hours  Assessment/Plan: Iv abx - wound vac - mobilize - Cr slightly elevated re check am   [name] Ball SCOTT 10/24/2011, 7:44 AM

## 2011-10-25 ENCOUNTER — Encounter (HOSPITAL_COMMUNITY): Payer: Self-pay | Admitting: Orthopaedic Surgery

## 2011-10-25 LAB — BASIC METABOLIC PANEL
BUN: 15 mg/dL (ref 6–23)
Chloride: 100 mEq/L (ref 96–112)
GFR calc Af Amer: 52 mL/min — ABNORMAL LOW (ref >90)
Potassium: 4 mEq/L (ref 3.5–5.1)

## 2011-10-25 NOTE — Progress Notes (Signed)
Subjective: 2 Days Post-Op Procedure(s) (LRB): SKIN GRAFT SPLIT THICKNESS (Right) IRRIGATION AND DEBRIDEMENT EXTREMITY (Right) Patient reports pain as mild.    Objective: Vital signs in last 24 hours: Temp:  [98 F (36.7 C)-99 F (37.2 C)] 98 F (36.7 C) (09/16 0657) Pulse Rate:  [70-86] 70  (09/16 0657) Resp:  [16-20] 16  (09/16 0657) BP: (98-106)/(59-64) 98/64 mmHg (09/16 0657) SpO2:  [95 %-97 %] 97 % (09/16 0657)  Intake/Output from previous day: 09/15 0701 - 09/16 0700 In: 880 [P.O.:880] Out: -  Intake/Output this shift:     Basename 10/23/11 0610 10/22/11 1042  HGB 10.4* 10.3*    Basename 10/23/11 0610 10/22/11 1042  WBC 6.4 7.7  RBC 3.58* 3.48*  HCT 32.7* 31.3*  PLT 390 342    Basename 10/24/11 0430 10/23/11 0610  NA 141 141  K 4.3 3.6  CL 103 101  CO2 32 28  BUN 9 12  CREATININE 1.63* 1.60*  GLUCOSE 97 91  CALCIUM 9.2 9.5   No results found for this basename: LABPT:2,INR:2 in the last 72 hours  Sensation intact distally Intact pulses distally No cellulitis present Compartment soft VAC with good seal/suction  Assessment/Plan: 2 Days Post-Op Procedure(s) (LRB): SKIN GRAFT SPLIT THICKNESS (Right) IRRIGATION AND DEBRIDEMENT EXTREMITY (Right) Plan for discharge tomorrow  Kathryne Hitch 10/25/2011, 7:00 AM

## 2011-10-26 MED ORDER — OXYCODONE-ACETAMINOPHEN 5-325 MG PO TABS: 1.0000 | ORAL_TABLET | Freq: Four times a day (QID) | ORAL | Status: DC | PRN

## 2011-10-26 MED ORDER — CIPROFLOXACIN HCL 500 MG PO TABS: 500.0000 mg | ORAL_TABLET | Freq: Two times a day (BID) | ORAL | Status: DC

## 2011-10-26 MED ORDER — METHOCARBAMOL 500 MG PO TABS: 500.0000 mg | ORAL_TABLET | Freq: Four times a day (QID) | ORAL | Status: DC | PRN
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
## 2011-10-26 NOTE — Progress Notes (Signed)
Utilization review completed. Garold Sheeler, RN, BSN. 

## 2011-10-26 NOTE — Progress Notes (Signed)
Patient was discharged in stable condition via wheelchair. Discharge instructions and prescriptons were given and explained.

## 2011-10-31 ENCOUNTER — Encounter (HOSPITAL_COMMUNITY): Payer: Self-pay | Admitting: *Deleted

## 2011-10-31 ENCOUNTER — Emergency Department (HOSPITAL_COMMUNITY)
Admission: EM | Admit: 2011-10-31 | Discharge: 2011-10-31 | Disposition: A | Discharge status: Home / Self Care | Payer: Managed Care, Other (non HMO) | Attending: Emergency Medicine | Admitting: Emergency Medicine

## 2011-10-31 DIAGNOSIS — Z4800 Encounter for change or removal of nonsurgical wound dressing: Secondary | ICD-10-CM | POA: Insufficient documentation

## 2011-10-31 DIAGNOSIS — IMO0001 Reserved for inherently not codable concepts without codable children: Secondary | ICD-10-CM

## 2011-10-31 NOTE — ED Provider Notes (Signed)
Medical screening examination/treatment/procedure(s) were performed by non-physician practitioner and as supervising physician I was immediately available for consultation/collaboration.   Dione Booze, MD 10/31/11 [phone]

## 2011-10-31 NOTE — ED Notes (Signed)
The pt wants someone to check her  Rt arm incision.  She had surgery on it last Tuesday and she thinks it is infected now
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
## 2011-10-31 NOTE — ED Notes (Signed)
The patient is AOx4 and comfortable with her discharge instructions. 

## 2012-11-01 ENCOUNTER — Ambulatory Visit (INDEPENDENT_AMBULATORY_CARE_PROVIDER_SITE_OTHER): Payer: Managed Care, Other (non HMO) | Admitting: Family Medicine

## 2012-11-01 ENCOUNTER — Encounter: Payer: Self-pay | Admitting: Family Medicine

## 2012-11-01 ENCOUNTER — Ambulatory Visit (HOSPITAL_COMMUNITY)
Admission: RE | Admit: 2012-11-01 | Discharge: 2012-11-01 | Disposition: A | Discharge status: Home / Self Care | Payer: Managed Care, Other (non HMO) | Source: Ambulatory Visit | Attending: Family Medicine | Admitting: Family Medicine

## 2012-11-01 ENCOUNTER — Ambulatory Visit: Payer: Self-pay | Admitting: Family Medicine

## 2012-11-01 VITALS — BP 132/86 | Ht 66.0 in | Wt 228.6 lb

## 2012-11-01 DIAGNOSIS — M25519 Pain in unspecified shoulder: Secondary | ICD-10-CM | POA: Insufficient documentation

## 2012-11-01 DIAGNOSIS — M25511 Pain in right shoulder: Secondary | ICD-10-CM

## 2012-11-01 MED ORDER — NABUMETONE 750 MG PO TABS: 750.0000 mg | ORAL_TABLET | Freq: Two times a day (BID) | ORAL | Status: DC
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
## 2012-11-14 ENCOUNTER — Telehealth: Payer: Self-pay | Admitting: Family Medicine

## 2012-11-14 MED ORDER — NABUMETONE 750 MG PO TABS: 750.0000 mg | ORAL_TABLET | Freq: Two times a day (BID) | ORAL | Status: DC

## 2012-11-14 NOTE — Telephone Encounter (Signed)
Discussed with patient

## 2012-11-14 NOTE — Telephone Encounter (Signed)
Let's do plus one ref

## 2012-11-14 NOTE — Telephone Encounter (Signed)
Med sent to pharm. [facility] to notify pt.  

## 2012-11-14 NOTE — Telephone Encounter (Signed)
nabumetone (RELAFEN) 750 MG tablet this script was sent in an receipted on 11/01/12 but pt didn't go to pick up till today. [facility] states they don't have it, can we resend please

## 2013-01-09 ENCOUNTER — Encounter: Payer: Self-pay | Admitting: Family Medicine

## 2013-01-09 ENCOUNTER — Ambulatory Visit (INDEPENDENT_AMBULATORY_CARE_PROVIDER_SITE_OTHER): Payer: Managed Care, Other (non HMO) | Admitting: Family Medicine

## 2013-01-09 VITALS — BP 114/86 | Temp 98.7°F | Ht 66.0 in | Wt 238.2 lb

## 2013-01-09 DIAGNOSIS — J209 Acute bronchitis, unspecified: Secondary | ICD-10-CM

## 2013-01-09 MED ORDER — AMOXICILLIN-POT CLAVULANATE 875-125 MG PO TABS: 1.0000 | ORAL_TABLET | Freq: Two times a day (BID) | ORAL | Status: DC

## 2013-01-09 MED ORDER — ALBUTEROL SULFATE HFA 108 (90 BASE) MCG/ACT IN AERS: 2.0000 | INHALATION_SPRAY | RESPIRATORY_TRACT | Status: DC | PRN

## 2013-01-09 NOTE — Progress Notes (Signed)
   Subjective:    Patient ID: Georgana Curio, female    DOB: 1975/07/03, 37 y.o.   MRN: [id number]  Cough This is a new problem. The current episode started in the past 7 days. The problem has been waxing and waning. The problem occurs every few minutes. The cough is productive of sputum. Associated symptoms include ear congestion, headaches, nasal congestion and rhinorrhea. The symptoms are aggravated by lying down. She has tried OTC cough suppressant for the symptoms. The treatment provided no relief.   Sig wheezing, bad cough at night  No fever,  Some smoke expsoure Nonsmoker  Review of Systems  HENT: Positive for rhinorrhea.   Respiratory: Positive for cough.   Neurological: Positive for headaches.   Otherwise negative    Objective:   Physical Exam Alert hydration good. HEENT normal. Other than moderate nasal congestion pharynx normal neck supple. Lungs bilateral wheezes bronchial cough heart regular in rhythm.       Assessment & Plan:  Impression acute bronchitis with reactive airways plan Ventolin 2 sprays every 4-6. Hycodan each bedtime when necessary for cough. Augmentin twice a day 10 days. Symptomatic care discussed. WSL

## 2013-05-02 ENCOUNTER — Ambulatory Visit (INDEPENDENT_AMBULATORY_CARE_PROVIDER_SITE_OTHER): Payer: Managed Care, Other (non HMO) | Admitting: Nurse Practitioner

## 2013-05-02 ENCOUNTER — Encounter: Payer: Self-pay | Admitting: Nurse Practitioner

## 2013-05-02 MED ORDER — PHENTERMINE HCL 37.5 MG PO TABS: 37.5000 mg | ORAL_TABLET | Freq: Every day | ORAL | Status: DC

## 2013-05-06 ENCOUNTER — Encounter: Payer: Self-pay | Admitting: Nurse Practitioner

## 2013-05-06 NOTE — Progress Notes (Signed)
Subjective:  Presents to discuss her weight. Had labs done at work in December. Not available during visit. Active job but otherwise no regular exercise. Has not done very well with her diet.  Objective:   BP 132/94  Ht 5\' 6"  (1.676 m)  Wt 241 lb (109.317 kg)  BMI 38.92 kg/m2 NAD. Alert, oriented. Lungs clear. Heart regular rate rhythm. Large waist circumference noted.  Assessment: Problem List Items Addressed This Visit     Other   Morbid obesity - Primary   Relevant Medications      phentermine (ADIPEX-P) 37.5 MG tablet     discussed potential adverse effects. DC med and call if any problems. Recommend regular activity and healthy diet. Discussed plans to help with her weight loss. Recommend that she bring her lab work to next visit. Return in about 1 month (around 06/02/2013).

## 2013-05-10 ENCOUNTER — Encounter: Payer: Self-pay | Admitting: *Deleted

## 2013-05-30 ENCOUNTER — Ambulatory Visit (INDEPENDENT_AMBULATORY_CARE_PROVIDER_SITE_OTHER): Payer: Managed Care, Other (non HMO) | Admitting: Nurse Practitioner

## 2013-05-30 ENCOUNTER — Encounter: Payer: Self-pay | Admitting: Nurse Practitioner

## 2013-05-30 VITALS — BP 132/82 | Ht 66.0 in | Wt 243.2 lb

## 2013-05-30 DIAGNOSIS — R5381 Other malaise: Secondary | ICD-10-CM

## 2013-05-30 DIAGNOSIS — R5383 Other fatigue: Secondary | ICD-10-CM

## 2013-05-30 MED ORDER — PHENTERMINE HCL 37.5 MG PO TABS: 37.5000 mg | ORAL_TABLET | Freq: Every day | ORAL | Status: DC

## 2013-05-31 ENCOUNTER — Encounter: Payer: Self-pay | Admitting: Nurse Practitioner

## 2013-05-31 LAB — HEPATIC FUNCTION PANEL
ALBUMIN: 4.2 g/dL (ref 3.5–5.2)
ALT: 21 U/L (ref 0–35)
AST: 18 U/L (ref 0–37)
Alkaline Phosphatase: 52 U/L (ref 39–117)
BILIRUBIN TOTAL: 0.4 mg/dL (ref 0.2–1.2)
Bilirubin, Direct: 0.1 mg/dL (ref 0.0–0.3)
Indirect Bilirubin: 0.3 mg/dL (ref 0.2–1.2)
TOTAL PROTEIN: 6.6 g/dL (ref 6.0–8.3)

## 2013-05-31 LAB — CBC WITH DIFFERENTIAL/PLATELET
BASOS ABS: 0.1 10*3/uL (ref 0.0–0.1)
Basophils Relative: 1 % (ref 0–1)
EOS ABS: 0.2 10*3/uL (ref 0.0–0.7)
EOS PCT: 4 % (ref 0–5)
HCT: 40.4 % (ref 36.0–46.0)
Hemoglobin: 13.6 g/dL (ref 12.0–15.0)
Lymphocytes Relative: 26 % (ref 12–46)
Lymphs Abs: 1.6 10*3/uL (ref 0.7–4.0)
MCH: 29.2 pg (ref 26.0–34.0)
MCHC: 33.7 g/dL (ref 30.0–36.0)
MCV: 86.9 fL (ref 78.0–100.0)
Monocytes Absolute: 0.5 10*3/uL (ref 0.1–1.0)
Monocytes Relative: 8 % (ref 3–12)
NEUTROS PCT: 61 % (ref 43–77)
Neutro Abs: 3.8 10*3/uL (ref 1.7–7.7)
PLATELETS: 327 10*3/uL (ref 150–400)
RBC: 4.65 MIL/uL (ref 3.87–5.11)
RDW: 13.9 % (ref 11.5–15.5)
WBC: 6.2 10*3/uL (ref 4.0–10.5)

## 2013-05-31 LAB — BASIC METABOLIC PANEL
BUN: 10 mg/dL (ref 6–23)
CHLORIDE: 106 meq/L (ref 96–112)
CO2: 29 mEq/L (ref 19–32)
CREATININE: 0.72 mg/dL (ref 0.50–1.10)
Calcium: 8.9 mg/dL (ref 8.4–10.5)
Glucose, Bld: 73 mg/dL (ref 70–99)
POTASSIUM: 5.1 meq/L (ref 3.5–5.3)
SODIUM: 140 meq/L (ref 135–145)

## 2013-05-31 LAB — TSH: TSH: 3.568 u[IU]/mL (ref 0.350–4.500)

## 2013-05-31 NOTE — Progress Notes (Signed)
Subjective:  Presents for recheck. Taking phentermine daily. Limited weight loss. Continues to have some fatigue. Has her lab work with her today for her job. No chest pain shortness of breath or palpitations.  Objective:   BP 132/82  Ht 5\' 6"  (1.676 m)  Wt 243 lb 3.2 oz (110.315 kg)  BMI 39.27 kg/m2 NAD. Alert, oriented. Thyroid nontender to palpation, no masses or goiters noted. Lungs clear. Heart regular rate rhythm. Lab work dated 02/06/2013: Fasting glucose 97, total cholesterol 178, HDL 40, LDL 122 and triglycerides 79.  Assessment:  Problem List Items Addressed This Visit     Other   Morbid obesity   Relevant Medications      phentermine (ADIPEX-P) 37.5 MG tablet    Other Visit Diagnoses   Fatigue    -  Primary    Relevant Orders       CBC with Differential       Hepatic function panel       Basic metabolic panel       TSH       Vit D  25 hydroxy (rtn osteoporosis monitoring)      Given refills on phentermine, stopped after next refill if no significant weight loss. Discussed importance of regular activity and healthy diet. Discussed programs available to help her with weight loss. Lab work pending. Recheck if fatigue persists.

## 2013-06-01 ENCOUNTER — Encounter: Payer: Self-pay | Admitting: Nurse Practitioner

## 2013-06-01 LAB — VITAMIN D 25 HYDROXY (VIT D DEFICIENCY, FRACTURES): VIT D 25 HYDROXY: 26 ng/mL — AB (ref 30–89)

## 2013-07-19 ENCOUNTER — Ambulatory Visit (INDEPENDENT_AMBULATORY_CARE_PROVIDER_SITE_OTHER): Payer: Managed Care, Other (non HMO) | Admitting: Nurse Practitioner

## 2013-07-19 ENCOUNTER — Encounter: Payer: Self-pay | Admitting: Nurse Practitioner

## 2013-07-19 VITALS — BP 122/78 | Ht 66.0 in | Wt 238.0 lb

## 2013-07-19 DIAGNOSIS — M25511 Pain in right shoulder: Secondary | ICD-10-CM

## 2013-07-19 DIAGNOSIS — M25519 Pain in unspecified shoulder: Secondary | ICD-10-CM

## 2013-07-25 ENCOUNTER — Encounter: Payer: Self-pay | Admitting: Nurse Practitioner

## 2013-07-25 DIAGNOSIS — M25511 Pain in right shoulder: Secondary | ICD-10-CM | POA: Insufficient documentation
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
## 2013-09-18 ENCOUNTER — Ambulatory Visit: Payer: Managed Care, Other (non HMO) | Admitting: Orthopedic Surgery

## 2013-09-18 ENCOUNTER — Encounter: Payer: Self-pay | Admitting: Orthopedic Surgery

## 2014-01-01 ENCOUNTER — Encounter: Payer: Self-pay | Admitting: Nurse Practitioner

## 2014-01-01 ENCOUNTER — Ambulatory Visit (INDEPENDENT_AMBULATORY_CARE_PROVIDER_SITE_OTHER): Payer: Managed Care, Other (non HMO) | Admitting: Nurse Practitioner

## 2014-01-01 MED ORDER — PHENTERMINE HCL 37.5 MG PO TABS: 37.5000 mg | ORAL_TABLET | Freq: Every day | ORAL | Status: DC

## 2014-01-05 ENCOUNTER — Encounter: Payer: Self-pay | Admitting: Nurse Practitioner

## 2014-01-05 NOTE — Progress Notes (Signed)
Subjective:  Presents for recheck. Doing well on phentermine. No significant adverse effects. Staying active. Doing much better with her diet.  Objective:   BP 132/74 mmHg  Ht 5\' 6"  (1.676 m)  Wt 237 lb (107.502 kg)  BMI 38.27 kg/m2 NAD. Alert, oriented. Lungs clear. Heart regular rate rhythm.  Assessment:  Problem List Items Addressed This Visit      Other   Morbid obesity - Primary   Relevant Medications      phentermine (ADIPEX-P) 37.5 MG tablet     Plan:  Meds ordered this encounter  Medications  . phentermine (ADIPEX-P) 37.5 MG tablet    Sig: Take 1 tablet (37.5 mg total) by mouth daily before breakfast.    Dispense:  30 tablet    Refill:  1    Order Specific Question:  Supervising Provider    Answer:  Merlyn AlbertLUKING, WILLIAM S [[id number]   This is her last prescription for phentermine for 6 months. Given information on Belviq and Qsymia. Reminded patient about wellness physical even though she has had a hysterectomy.

## 2014-04-25 ENCOUNTER — Telehealth: Payer: Self-pay | Admitting: Family Medicine

## 2014-04-25 NOTE — Telephone Encounter (Signed)
Pt spoke with her insurance and she would like to go ahead and try Belviq. It will require Prior Auth with her insurance, please send to [facility] (explained that I have to receive denial from pharmacy to process prior auth, pt verbalized understanding)

## 2014-04-27 ENCOUNTER — Other Ambulatory Visit: Payer: Self-pay | Admitting: Nurse Practitioner

## 2014-04-27 MED ORDER — LORCASERIN HCL 10 MG PO TABS: ORAL_TABLET | ORAL | Status: DC

## 2014-04-29 MED ORDER — LORCASERIN HCL 10 MG PO TABS: ORAL_TABLET | ORAL | Status: DC

## 2014-04-29 NOTE — Progress Notes (Signed)
Patient notified that scripts are ready for pickup.  

## 2015-02-04 ENCOUNTER — Ambulatory Visit: Payer: Managed Care, Other (non HMO) | Admitting: Nurse Practitioner

## 2015-08-25 IMAGING — CR DG SHOULDER 2+V*R*
3 series · 3 of 3 positions shown · non-contrast
Comparison: None.

CLINICAL DATA: Right shoulder pain for 2 yrs. No known injury.

EXAM:
RIGHT SHOULDER - 2+ VIEW

[view not recorded (1 of 3)]
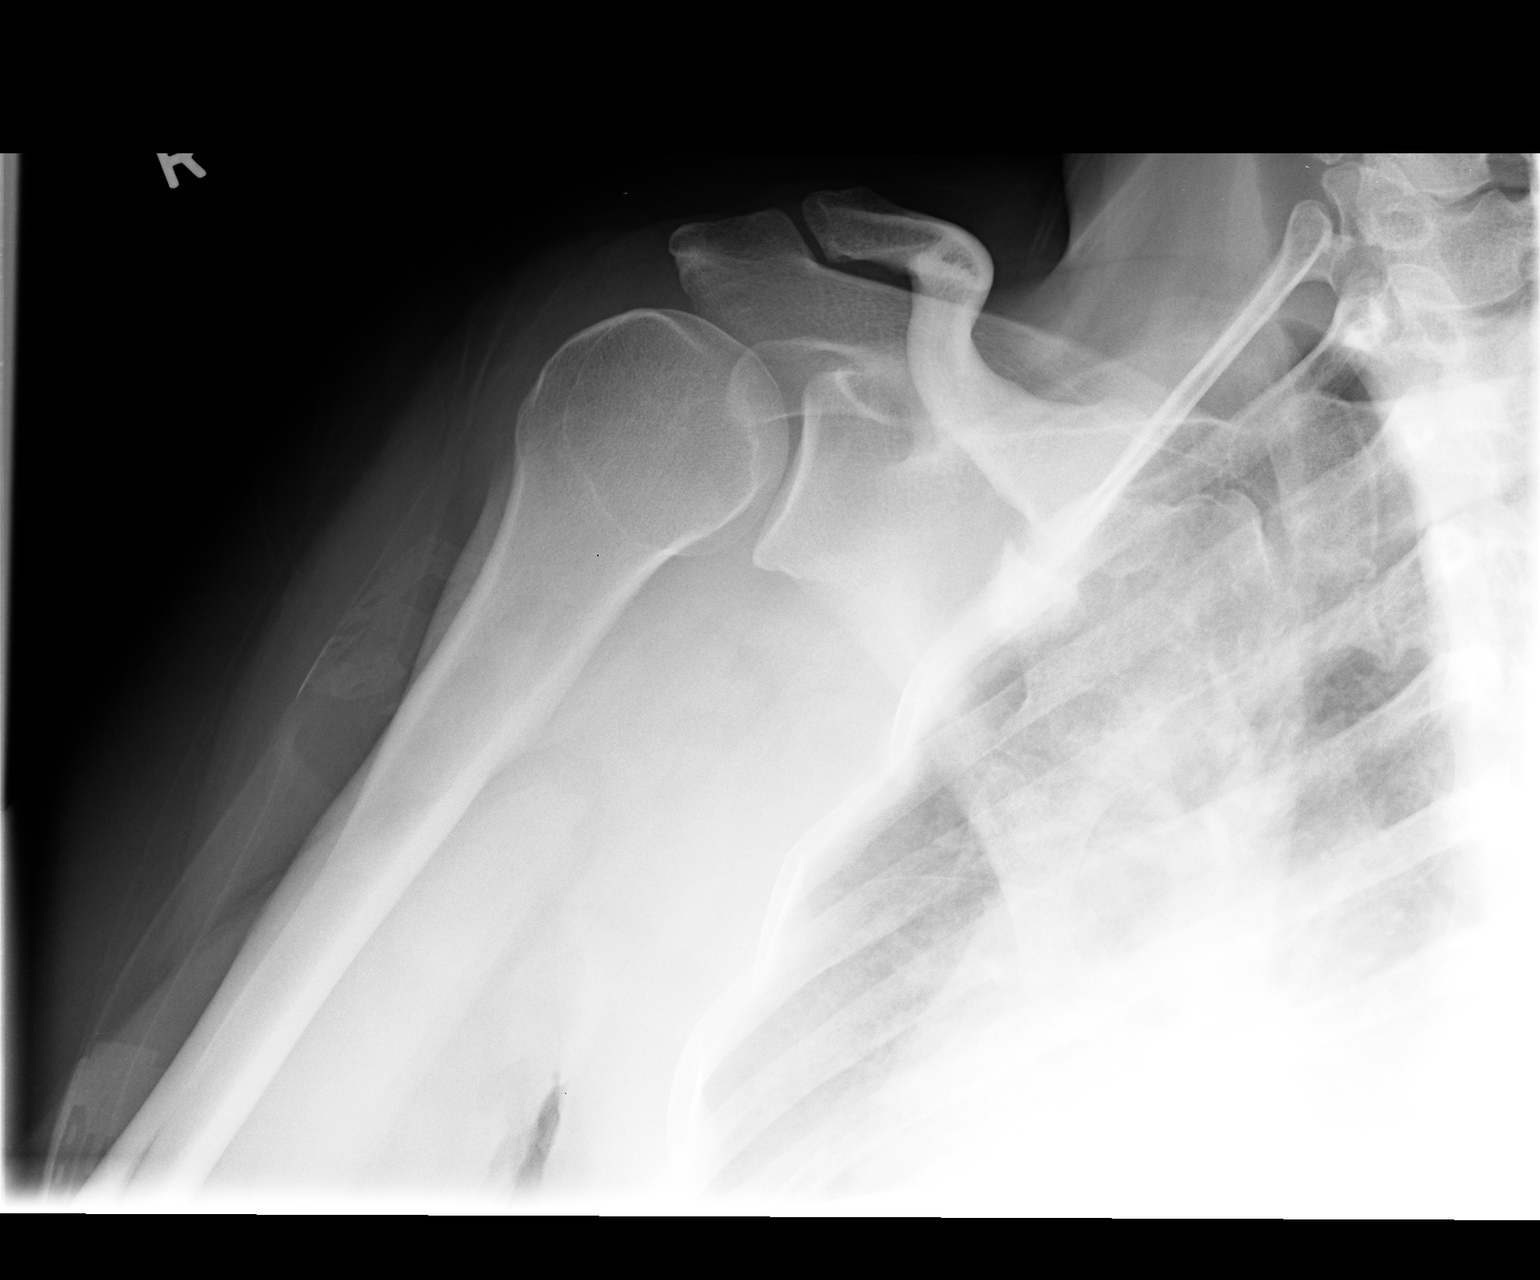

[view not recorded (2 of 3)]
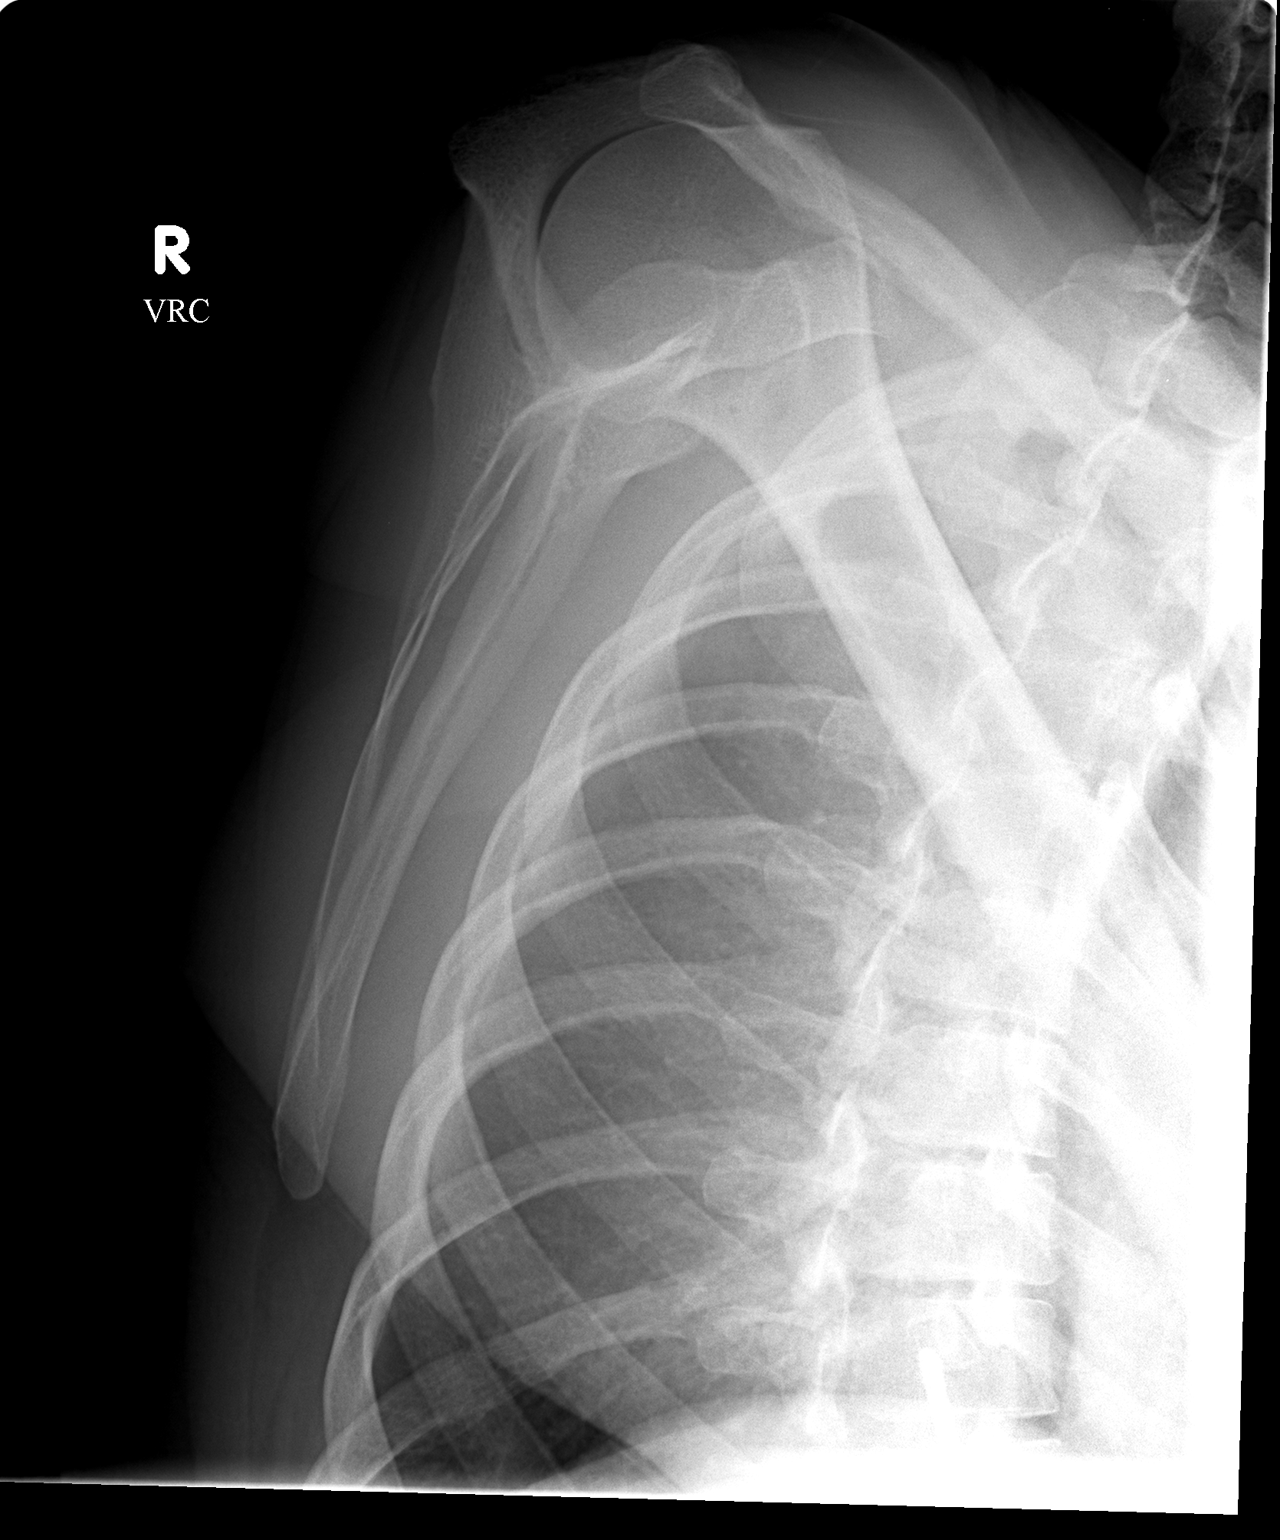

[view not recorded (3 of 3)]
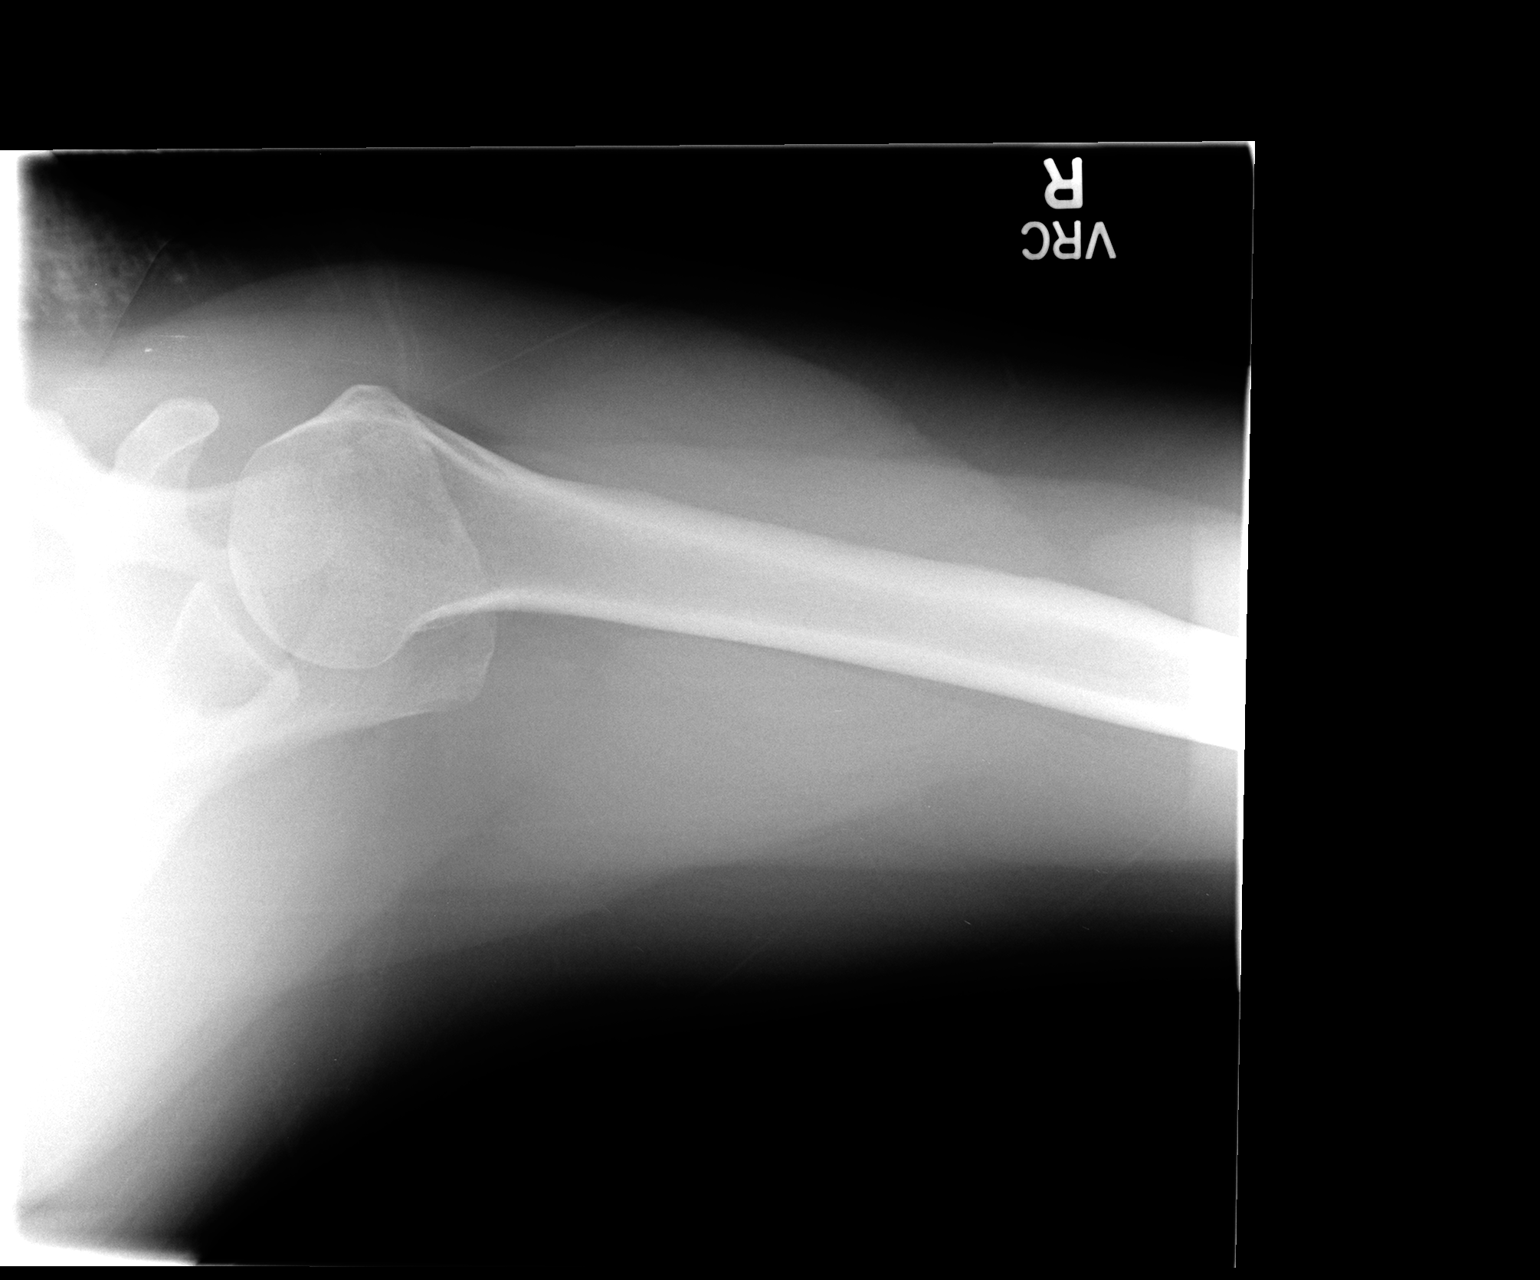

[3 of 3 positions shown; findings below may reference images not displayed]

FINDINGS: There is no evidence of fracture or dislocation. There is no
evidence of arthropathy or other focal bone abnormality. Soft
tissues are unremarkable.
IMPRESSION: Negative.

## 2015-12-04 ENCOUNTER — Ambulatory Visit: Payer: Self-pay | Admitting: Nurse Practitioner

## 2018-02-02 ENCOUNTER — Other Ambulatory Visit: Payer: Self-pay

## 2018-02-02 ENCOUNTER — Emergency Department (HOSPITAL_COMMUNITY)
Admission: EM | Admit: 2018-02-02 | Discharge: 2018-02-02 | Disposition: A | Discharge status: Home / Self Care | Payer: Managed Care, Other (non HMO) | Attending: Emergency Medicine | Admitting: Emergency Medicine

## 2018-02-02 ENCOUNTER — Encounter (HOSPITAL_COMMUNITY): Payer: Self-pay | Admitting: Emergency Medicine

## 2018-02-02 DIAGNOSIS — Z89029 Acquired absence of unspecified finger(s): Secondary | ICD-10-CM | POA: Insufficient documentation

## 2018-02-02 DIAGNOSIS — M62838 Other muscle spasm: Secondary | ICD-10-CM

## 2018-02-02 DIAGNOSIS — M542 Cervicalgia: Secondary | ICD-10-CM | POA: Insufficient documentation

## 2018-02-02 MED ORDER — DIAZEPAM 5 MG PO TABS
10.0000 mg | ORAL_TABLET | Freq: Once | ORAL | Status: AC
  Administered 2018-02-02: 10 mg via ORAL
  Filled 2018-02-02: qty 2

## 2018-02-02 NOTE — Discharge Instructions (Signed)
Take 4 over the counter ibuprofen tablets 3 times a day or 2 over-the-counter naproxen tablets twice a day for pain. Also take tylenol 1000mg (2 extra strength) four times a day.    Use the sling for comfort.  Take it out as you need.  Follow up with the family doctor.  Return for weakness or numbness of the arm.

## 2018-02-02 NOTE — ED Provider Notes (Signed)
[facility] Provider Note   CSN: [id number] Arrival date & time: 02/02/18  1149     History   Chief Complaint Chief Complaint  Patient presents with  . Neck Pain    HPI [name] Ball is a 42 y.o. female.  42 y.o female with a PMH of insomnia with a chief complaint of neck pain x 1 month. Patient describes the pain as sharp, tingling radiating from the middle neck to the right side. She reports this pain is worse with picking up objects from the floor. She has tried tylenol, ibuprofen but reports no relieve in symptoms. She rates her pain a 8/10. She denies any fever, chest pain, bllurry vision, dizziness or weakness.      Past Medical History:  Diagnosis Date  . Insomnia     Patient Active Problem List   Diagnosis Date Noted  . Right shoulder pain 07/25/2013  . Morbid obesity (HCC) 05/06/2013  . Abrasion, elbow with infection 10/18/2011    Past Surgical History:  Procedure Laterality Date  . CESAREAN SECTION    . FINGER AMPUTATION    . I&D EXTREMITY  10/18/2011   Procedure: IRRIGATION AND DEBRIDEMENT EXTREMITY;  Surgeon: Kathryne Hitchhristopher Y Blackman, MD;  [facility];  Service: Orthopedics;  Laterality: Right;  Right elbow irrigation and debridement  . I&D EXTREMITY  10/19/2011   Procedure: IRRIGATION AND DEBRIDEMENT EXTREMITY;  Surgeon: Kathryne Hitchhristopher Y Blackman, MD;  [facility];  Service: Orthopedics;  Laterality: Right;  Right elbow repeat irrigation and debridement, Vac change  . I&D EXTREMITY  10/23/2011   Procedure: IRRIGATION AND DEBRIDEMENT EXTREMITY;  Surgeon: Kathryne Hitchhristopher Y Blackman, MD;  [facility];  Service: Orthopedics;  Laterality: Right;  . PARTIAL HYSTERECTOMY    . 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     OB History   No obstetric history on file.      Home Medications    Prior to Admission medications   Medication Sig Start Date End Date Taking? Authorizing Provider  albuterol (PROVENTIL HFA;VENTOLIN HFA) 108 (90 BASE) MCG/ACT inhaler Inhale 2 puffs into the lungs every 4 (four) hours as needed for wheezing. 01/09/13   Merlyn AlbertLuking, William S, MD  IBUPROFEN PO Take by mouth.    [provider]  Lorcaserin HCl (BELVIQ) 10 MG TABS One po BID for weight loss 04/29/14   Sherie DonHoskins, Carolyn C, NP  nabumetone (RELAFEN) 750 MG tablet Take 1 tablet (750 mg total) by mouth 2 (two) times daily. 11/14/12   Merlyn AlbertLuking, William S, MD    Family History Family History  Problem Relation Age of Onset  . Cancer Mother        breast    Social History Social History   Tobacco Use  . Smoking status: Never Smoker  . Smokeless tobacco: Never Used  Substance Use Topics  . Alcohol use: Yes    Comment: drinks once time a week  . Drug use: No     Allergies   Patient has no known allergies.   Review of Systems Review of Systems  Constitutional: Negative for fever.  Musculoskeletal: Positive for myalgias and neck pain. Negative for neck stiffness.     Physical Exam Updated Vital Signs BP 133/86 (BP Location:  Right Arm)   Pulse 87   Temp 98.2 F (36.8 C) (Oral)   Resp 17   SpO2 100%   Physical Exam Vitals signs and nursing note reviewed.  Constitutional:      General: She is not in acute distress.    Appearance: She is well-developed.  HENT:     Head: Normocephalic and atraumatic.     Mouth/Throat:     Pharynx: No oropharyngeal exudate.  Eyes:     Pupils: Pupils are equal, round, and reactive to light.  Neck:     Musculoskeletal: Normal range of motion.  Cardiovascular:     Rate and Rhythm: Regular rhythm.     Heart sounds: Normal heart sounds.  Pulmonary:     Effort: Pulmonary effort is normal. No respiratory distress.     Breath sounds: Normal  breath sounds.  Abdominal:     General: Bowel sounds are normal. There is no distension.     Palpations: Abdomen is soft.     Tenderness: There is no abdominal tenderness.  Musculoskeletal:        General: No tenderness or deformity.     Cervical back: She exhibits pain and spasm. She exhibits no edema and no deformity.       Back:     Right lower leg: No edema.     Left lower leg: No edema.     Comments: Full ROM of the right shoulder, pulses present strength 5/5 on BUE.   Skin:    General: Skin is warm and dry.  Neurological:     Mental Status: She is alert and oriented to person, place, and time.      ED Treatments / Results  Labs (all labs ordered are listed, but only abnormal results are displayed) Labs Reviewed - No data to display  EKG None  Radiology No results found.  Procedures Procedures (including critical care time)  Medications Ordered in ED Medications  diazepam (VALIUM) tablet 10 mg (10 mg Oral Given 02/02/18 1405)     Initial Impression / Assessment and Plan / ED Course  I have reviewed the triage vital signs and the nursing notes.  Pertinent labs & imaging results that were available during my care of the patient were reviewed by me and considered in my medical decision making (see chart for details).     Patient presents with neck pain, ports she is had this in the past but this time it has been exacerbated.  We will try her on some Valium.  When I attempted to reassess this patient patient was not found in the room, I rechecked in patient's room twice unable to find patient further evaluate patient.  She most likely eloped from the ED after receiving Valium for her muscle spasms.  Unable to provide further work-up.  Final Clinical Impressions(s) / ED Diagnoses   Final diagnoses:  Neck pain    ED Discharge Orders    None       Claude MangesSoto, Alysabeth Scalia, PA-C 02/02/18 1447    Melene PlanFloyd, Dan, DO 02/02/18 1549

## 2018-02-02 NOTE — ED Notes (Signed)
Pt stable, ambulatory, states understanding of discharge instructions 

## 2018-02-02 NOTE — ED Notes (Signed)
Pt eloped without notifying anyone.   Will d/c AMA

## 2018-02-02 NOTE — ED Triage Notes (Signed)
Pt reports neck pain x 1 month. Pt reports pain started radiating to her shoulder x 1 week, then down her R arm 3 days ago. Pt reports difficulty using her R arm due to pain.

## 2019-03-12 ENCOUNTER — Encounter: Payer: Self-pay | Admitting: Family Medicine

## 2019-03-13 ENCOUNTER — Other Ambulatory Visit: Payer: Self-pay

## 2019-03-13 ENCOUNTER — Ambulatory Visit: Payer: Managed Care, Other (non HMO) | Attending: Internal Medicine

## 2019-03-13 DIAGNOSIS — Z20822 Contact with and (suspected) exposure to covid-19: Secondary | ICD-10-CM

## 2019-03-14 LAB — NOVEL CORONAVIRUS, NAA: SARS-CoV-2, NAA: DETECTED — AB

## 2019-03-22 ENCOUNTER — Ambulatory Visit: Payer: Managed Care, Other (non HMO) | Attending: Family Medicine

## 2019-03-22 ENCOUNTER — Other Ambulatory Visit: Payer: Self-pay

## 2019-03-22 DIAGNOSIS — Z20822 Contact with and (suspected) exposure to covid-19: Secondary | ICD-10-CM

## 2019-03-23 LAB — NOVEL CORONAVIRUS, NAA: SARS-CoV-2, NAA: NOT DETECTED

## 2021-07-08 ENCOUNTER — Ambulatory Visit (INDEPENDENT_AMBULATORY_CARE_PROVIDER_SITE_OTHER): Payer: No Typology Code available for payment source | Admitting: Family Medicine

## 2021-07-08 ENCOUNTER — Encounter: Payer: Self-pay | Admitting: Family Medicine

## 2021-07-08 VITALS — BP 130/90 | HR 91 | Temp 96.8°F | Ht 67.0 in | Wt 243.8 lb

## 2021-07-08 DIAGNOSIS — M25521 Pain in right elbow: Secondary | ICD-10-CM | POA: Diagnosis not present

## 2021-07-08 DIAGNOSIS — G8929 Other chronic pain: Secondary | ICD-10-CM | POA: Diagnosis not present

## 2021-07-08 DIAGNOSIS — R079 Chest pain, unspecified: Secondary | ICD-10-CM | POA: Insufficient documentation

## 2021-07-08 DIAGNOSIS — R200 Anesthesia of skin: Secondary | ICD-10-CM | POA: Diagnosis not present

## 2021-07-08 NOTE — Patient Instructions (Signed)
Review chest discomfort back in 2 weeks, we will do some work-up at that time.  If you develop any chest pain between now and then that is severe or associate with shortness of breath please go to the emergency department.  Please follow-up in 2 weeks to discuss her ongoing Tkach pain and numbness.
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
## 2021-07-08 NOTE — Assessment & Plan Note (Signed)
Extensive orthopedic damage to left Fine, possibly to the right, Pain and numbness traumatic OA versus carpal tunnel? Continue trial ibuprofen Follow-up in 1 month

## 2021-07-08 NOTE — Assessment & Plan Note (Signed)
Likely MSK given tenderness on palpation and intermittent nature and improvement with ibuprofen Continue OTC ibuprofen We will follow-up in 1 month ED and return precautions discussed

## 2021-07-22 ENCOUNTER — Encounter: Payer: Self-pay | Admitting: Family Medicine

## 2021-07-22 ENCOUNTER — Ambulatory Visit (INDEPENDENT_AMBULATORY_CARE_PROVIDER_SITE_OTHER): Payer: No Typology Code available for payment source | Admitting: Family Medicine

## 2021-07-22 VITALS — BP 124/84 | HR 96 | Temp 97.0°F | Wt 243.6 lb

## 2021-07-22 DIAGNOSIS — Z13220 Encounter for screening for lipoid disorders: Secondary | ICD-10-CM | POA: Diagnosis not present

## 2021-07-22 DIAGNOSIS — Z131 Encounter for screening for diabetes mellitus: Secondary | ICD-10-CM

## 2021-07-22 DIAGNOSIS — R079 Chest pain, unspecified: Secondary | ICD-10-CM

## 2021-07-22 DIAGNOSIS — Z6838 Body mass index (BMI) 38.0-38.9, adult: Secondary | ICD-10-CM | POA: Insufficient documentation

## 2021-07-22 DIAGNOSIS — E669 Obesity, unspecified: Secondary | ICD-10-CM

## 2021-07-22 DIAGNOSIS — R200 Anesthesia of skin: Secondary | ICD-10-CM | POA: Diagnosis not present

## 2021-07-22 DIAGNOSIS — M25521 Pain in right elbow: Secondary | ICD-10-CM | POA: Diagnosis not present

## 2021-07-22 DIAGNOSIS — G8929 Other chronic pain: Secondary | ICD-10-CM

## 2021-07-22 MED ORDER — DICLOFENAC SODIUM 75 MG PO TBEC: 75.0000 mg | DELAYED_RELEASE_TABLET | Freq: Two times a day (BID) | ORAL | 0 refills | Status: DC

## 2021-07-22 MED ORDER — PREDNISONE 20 MG PO TABS: 20.0000 mg | ORAL_TABLET | Freq: Every day | ORAL | 0 refills | Status: AC
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
## 2021-07-22 NOTE — Assessment & Plan Note (Signed)
Bilateral, concern for carpal tunnel, recommend trial of oral steroids prednisone 20 mg daily for 10 days, start diclofenac after this follow-up if no improvement, will likely need referral to orthopedic surgery given extensive history of [place] trauma

## 2021-07-22 NOTE — Patient Instructions (Signed)
Take prednisone for 10 days, then start diclofenac

## 2021-07-22 NOTE — Assessment & Plan Note (Signed)
Patient reports that this has resolved and has not had any more recurrence Continue to monitor

## 2021-07-22 NOTE — Assessment & Plan Note (Signed)
Recommend screening labs including TSH, CBC, UA, CMP, hemoglobin A1c Unable to obtain labs today due to difficult stick, will have patient come back in the future Encourage diet and exercise

## 2021-07-22 NOTE — Assessment & Plan Note (Signed)
Status post significant injury Recommend trial of oral steroids prednisone, followed by diclofenac after this If no improvement recommend referral to orthopedic surgery

## 2021-07-23 ENCOUNTER — Other Ambulatory Visit: Payer: No Typology Code available for payment source

## 2021-07-28 ENCOUNTER — Other Ambulatory Visit: Payer: Self-pay

## 2021-07-28 ENCOUNTER — Other Ambulatory Visit (INDEPENDENT_AMBULATORY_CARE_PROVIDER_SITE_OTHER): Payer: No Typology Code available for payment source

## 2021-07-28 DIAGNOSIS — E669 Obesity, unspecified: Secondary | ICD-10-CM

## 2021-07-28 DIAGNOSIS — Z6838 Body mass index (BMI) 38.0-38.9, adult: Secondary | ICD-10-CM | POA: Diagnosis not present

## 2021-07-28 LAB — URINALYSIS
Bilirubin Urine: NEGATIVE
Hgb urine dipstick: NEGATIVE
Ketones, ur: NEGATIVE
Leukocytes,Ua: NEGATIVE
Nitrite: NEGATIVE
Specific Gravity, Urine: <=1.005 — AB (ref 1.000–1.030)
Total Protein, Urine: NEGATIVE
Urine Glucose: NEGATIVE
Urobilinogen, UA: 0.2 (ref 0.0–1.0)
pH: 7 (ref 5.0–8.0)

## 2021-07-28 NOTE — Addendum Note (Signed)
Addended by: Varney Biles on: 07/28/2021 03:11 PM   Modules accepted: Orders

## 2021-08-19 ENCOUNTER — Ambulatory Visit: Payer: No Typology Code available for payment source | Admitting: Family Medicine

## 2022-12-14 DIAGNOSIS — M5412 Radiculopathy, cervical region: Secondary | ICD-10-CM | POA: Diagnosis not present

## 2022-12-22 ENCOUNTER — Telehealth: Payer: Self-pay | Admitting: Family Medicine

## 2022-12-22 NOTE — Telephone Encounter (Signed)
LVM notifying pt to give [place] a call back at the office to sch a follow up appt as she is past due for one.

## 2023-01-04 DIAGNOSIS — M5412 Radiculopathy, cervical region: Secondary | ICD-10-CM | POA: Diagnosis not present

## 2023-02-08 DIAGNOSIS — H9222 Otorrhagia, left ear: Secondary | ICD-10-CM | POA: Diagnosis not present

## 2023-02-14 ENCOUNTER — Other Ambulatory Visit: Payer: Self-pay | Admitting: Orthopedic Surgery

## 2023-02-14 DIAGNOSIS — M5412 Radiculopathy, cervical region: Secondary | ICD-10-CM

## 2023-02-15 ENCOUNTER — Other Ambulatory Visit: Payer: Self-pay | Admitting: Orthopedic Surgery

## 2023-02-15 DIAGNOSIS — M5412 Radiculopathy, cervical region: Secondary | ICD-10-CM

## 2023-02-25 ENCOUNTER — Ambulatory Visit
Admission: RE | Admit: 2023-02-25 | Discharge: 2023-02-25 | Disposition: A | Discharge status: Home / Self Care | Payer: Medicaid Other | Source: Ambulatory Visit | Attending: Orthopedic Surgery | Admitting: Orthopedic Surgery

## 2023-02-25 DIAGNOSIS — M5412 Radiculopathy, cervical region: Secondary | ICD-10-CM

## 2023-03-14 DIAGNOSIS — Z01818 Encounter for other preprocedural examination: Secondary | ICD-10-CM | POA: Diagnosis not present

## 2023-03-15 ENCOUNTER — Ambulatory Visit: Payer: Medicaid Other | Admitting: Student

## 2023-03-15 ENCOUNTER — Encounter: Payer: Self-pay | Admitting: Student

## 2023-03-15 VITALS — BP 122/68 | HR 104 | Temp 97.3°F | Resp 20 | Ht 66.0 in | Wt 248.4 lb

## 2023-03-15 DIAGNOSIS — Z7689 Persons encountering health services in other specified circumstances: Secondary | ICD-10-CM | POA: Insufficient documentation

## 2023-03-15 DIAGNOSIS — R200 Anesthesia of skin: Secondary | ICD-10-CM | POA: Diagnosis not present

## 2023-03-15 DIAGNOSIS — Z0000 Encounter for general adult medical examination without abnormal findings: Secondary | ICD-10-CM | POA: Insufficient documentation

## 2023-03-15 DIAGNOSIS — Z6841 Body Mass Index (BMI) 40.0 and over, adult: Secondary | ICD-10-CM | POA: Diagnosis not present

## 2023-03-15 NOTE — Assessment & Plan Note (Signed)
The patient presents to establish care and requests medical clearance for procedure with Dr. Loralie Champagne. All documentation has been reviewed as well as most recent labs and EKG.  Based on these results the patient is medically cleared for surgery.
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
## 2023-05-31 ENCOUNTER — Encounter: Admitting: Student

## 2023-06-08 ENCOUNTER — Ambulatory Visit: Admitting: Student

## 2023-06-08 ENCOUNTER — Encounter: Payer: Self-pay | Admitting: Student

## 2023-06-08 VITALS — BP 122/76 | HR 107 | Temp 97.7°F | Resp 18 | Ht 66.0 in | Wt 250.6 lb

## 2023-06-08 DIAGNOSIS — Z1211 Encounter for screening for malignant neoplasm of colon: Secondary | ICD-10-CM

## 2023-06-08 DIAGNOSIS — Z131 Encounter for screening for diabetes mellitus: Secondary | ICD-10-CM

## 2023-06-08 DIAGNOSIS — Z6838 Body mass index (BMI) 38.0-38.9, adult: Secondary | ICD-10-CM

## 2023-06-08 DIAGNOSIS — E559 Vitamin D deficiency, unspecified: Secondary | ICD-10-CM | POA: Diagnosis not present

## 2023-06-08 DIAGNOSIS — E66812 Obesity, class 2: Secondary | ICD-10-CM

## 2023-06-08 DIAGNOSIS — Z1231 Encounter for screening mammogram for malignant neoplasm of breast: Secondary | ICD-10-CM

## 2023-06-08 DIAGNOSIS — Z0000 Encounter for general adult medical examination without abnormal findings: Secondary | ICD-10-CM

## 2023-06-08 DIAGNOSIS — Z13220 Encounter for screening for lipoid disorders: Secondary | ICD-10-CM

## 2023-06-08 DIAGNOSIS — Z6841 Body Mass Index (BMI) 40.0 and over, adult: Secondary | ICD-10-CM | POA: Diagnosis not present

## 2023-06-08 NOTE — Assessment & Plan Note (Signed)
 Ordered Cologuard test for colon cancer screening.

## 2023-06-08 NOTE — Assessment & Plan Note (Signed)
 Same as above

## 2023-06-08 NOTE — Assessment & Plan Note (Signed)
 Ordered mammogram for [facility].

## 2023-06-08 NOTE — Assessment & Plan Note (Signed)
 CPE completed today with screening labs: - Planned laboratory tests, including CBC, kidney function, liver function, lipid panel, thyroid function, vitamin D  levels, hepatitis C, and HIV screening.   USPSTF grade A and B recommendations reviewed with patient; age-appropriate recommendations, preventive care, screening tests, etc discussed and encouraged; healthy living encouraged; see AVS for patient education given to patient   Discussed importance of 150 minutes of physical activity weekly, AHA exercise recommendations given to pt in AVS/handout   Discussed importance of healthy diet:  eating lean meats and proteins, avoiding trans fats and saturated fats, avoid simple sugars and excessive carbs in diet, eat 6 servings of fruit/vegetables daily and drink plenty of water and avoid sweet beverages.     Recommended pt to do annual eye exam and routine dental exams/cleanings   Depression, alcohol, fall screening completed as documented above and per flowsheets   Advance Care planning information and packet discussed and offered today, encouraged pt to discuss with family members/spouse/partner/friends and complete Advanced directive packet and bring copy to office

## 2023-06-08 NOTE — Progress Notes (Signed)
 Patient: [name] Ball, Female    DOB: 12-Nov-1975, 48 y.o.   MRN: [id number] [name] Badder, NP Visit Date: 06/08/2023   Chief Complaint  Patient presents with   Annual Exam    CPE   Subjective:   Annual physical exam:  [name] Ball is a 48 y.o. female who presents today for annual health maintenance exam: The patient may be due for the following health maintenance studies: mammogram, colonoscopy, bone density screening, cervical pap/breast exam, and screening labs.    For exercise and activity the patient participates-no extra activity.  Diet: follows a regular diet. Smoke: does not smoke Alcohol screening: drinks alcohol on occasion  Eye Exam: reports that she has never had an exam but is wearing readers.  Dental Exam: she is due for dental exam   Past medical, surgical, social and family histories reviewed and updated.    Separate from wellness visit today:  **The patient was informed that charges may be incurred for discussing concerns outside of annual exam.   SDOH Screenings   Depression (PHQ2-9): Low Risk  (07/08/2021)  Tobacco Use: Low Risk  (06/08/2023)     USPSTF grade A and B recommendations - reviewed and addressed today  Immunizations and Health Maintenance: Health Maintenance  Topic Date Due   HIV Screening  Never done   Hepatitis C Screening  Never done   Colonoscopy  Never done   DTaP/Tdap/Td (2 - Td or Tdap) 06/07/2024 (Originally 10/15/2021)   INFLUENZA VACCINE  09/09/2023   HPV VACCINES  Aged Out   Meningococcal B Vaccine  Aged Out   COVID-19 Vaccine  Discontinued      Immunizations: Immunization History  Administered Date(s) Administered   Tdap 10/16/2011   Hep C Screening: will screen today.   STD testing and prevention (HIV/chl/gon/syphilis):  see above, no additional testing desired by pt today  Intimate partner violence:none   Sexual History/Pain during Intercourse: Widowed  Menstrual History/LMP/Abnormal Bleeding:  No LMP  recorded. Patient has had a hysterectomy.  Incontinence Symptoms: denies concern   Breast cancer:  Last Mammogram: patient has not had mammogram  BRCA gene screening: n/a  Cervical cancer screening: partial hysterectomy  Pt denies family hx of cancers - breast, ovarian, uterine, colon:     Osteoporosis:   Discussion on osteoporosis per age, including high calcium and vitamin D  supplementation, weight bearing exercises Pt is is supplementing with daily calcium/Vit D.  Skin cancer:  Hx of skin CA - none  Discussed atypical lesions- pt denies   Colorectal cancer:   Colonoscopy is due: cologaurd  Discussed concerning signs and sx of CRC, pt denies  Lung cancer:   Low Dose CT Chest recommended if Age 1-80 years, 20 pack-year currently smoking OR have quit w/in 15years. Patient does not qualify.    Social History   Tobacco Use   Smoking status: Never   Smokeless tobacco: Never  Vaping Use   Vaping status: Never Used  Substance Use Topics   Alcohol use: Yes    Alcohol/week: 4.0 standard drinks of alcohol    Types: 2 Cans of beer, 2 Shots of liquor per week    Comment: Occasional   Drug use: Never    Frequency: 7.0 times per week    Types: Marijuana    Comment: daily for pain and anxiety       Family History  Problem Relation Age of Onset   Cancer Mother        breast  Cancer Father      Blood pressure/Hypertension: BP Readings from Last 3 Encounters:  06/08/23 122/76  03/15/23 122/68  07/22/21 124/84     Social History       Social History   Socioeconomic History   Marital status: Widowed    Spouse name: Not on file   Number of children: Not on file   Years of education: Not on file   Highest education level: Not on file  Occupational History   Occupation: Customer Service Rep  Tobacco Use   Smoking status: Never   Smokeless tobacco: Never  Vaping Use   Vaping status: Never Used  Substance and Sexual Activity   Alcohol use: Yes     Alcohol/week: 4.0 standard drinks of alcohol    Types: 2 Cans of beer, 2 Shots of liquor per week    Comment: Occasional   Drug use: Never    Frequency: 7.0 times per week    Types: Marijuana    Comment: daily for pain and anxiety   Sexual activity: Not Currently  Other Topics Concern   Not on file  Social History Narrative   Not on file   Social Drivers of Health   Financial Resource Strain: Not on file  Food Insecurity: Not on file  Transportation Needs: Not on file  Physical Activity: Not on file  Stress: Not on file  Social Connections: Not on file    Family History        Family History  Problem Relation Age of Onset   Cancer Mother        breast   Cancer Father     Patient Active Problem List   Diagnosis Date Noted   Screening for diabetes mellitus (DM) 06/08/2023   Screening for colon cancer 06/08/2023   Encounter for screening mammogram for malignant neoplasm of breast 06/08/2023   Vitamin D  deficiency 06/08/2023   Annual physical exam 03/15/2023   Chronic elbow pain, right 07/22/2021   Class 2 obesity with body mass index (BMI) of 38.0 to 38.9 in adult 07/22/2021   Chest pain 07/08/2021   Tillman numbness 07/08/2021    Past Surgical History:  Procedure Laterality Date   CESAREAN SECTION     ELBOW SURGERY Right    FINGER AMPUTATION     I & D EXTREMITY  10/18/2011   Procedure: IRRIGATION AND DEBRIDEMENT EXTREMITY;  Surgeon: Arnie Lao, MD;  Location: [facility] OR;  Service: Orthopedics;  Laterality: Right;  Right elbow irrigation and debridement   I & D EXTREMITY  10/19/2011   Procedure: IRRIGATION AND DEBRIDEMENT EXTREMITY;  Surgeon: Arnie Lao, MD;  Location: [facility] OR;  Service: Orthopedics;  Laterality: Right;  Right elbow repeat irrigation and debridement, Vac change   I & D EXTREMITY  10/23/2011   Procedure: IRRIGATION AND DEBRIDEMENT EXTREMITY;  Surgeon: Arnie Lao, MD;  Location: [facility] OR;  Service: Orthopedics;  Laterality:  Right;   PARTIAL HYSTERECTOMY     SKIN SPLIT GRAFT  10/23/2011   Procedure: SKIN GRAFT SPLIT THICKNESS;  Surgeon: Arnie Lao, MD;  Location: [facility] OR;  Service: Orthopedics;  Laterality: Right;  Split thickness skin graft to right elbow from right thigh; 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     Current Outpatient Medications:    ibuprofen  (ADVIL ) 200 MG tablet, Take 200 mg by mouth every 6 (six) hours as needed., Disp: , Rfl:    VITAMIN D , CHOLECALCIFEROL, PO, Take 1 tablet by mouth daily.,  Disp: , Rfl:   No Known Allergies  Patient Care Team: Cherrise Occhipinti, Trina Fujita, NP as PCP - General (Internal Medicine)  Advanced Care Planning:  A voluntary discussion about advance care planning including the explanation and discussion of advance directives.   Discussed health care proxy and Living will, and the patient was able to identify a health care proxy as Geralyn Knee (mother).   Patient does not have a living will at present time.    Review of Systems Review of Systems  Constitutional: Negative.   HENT: Negative.    Eyes: Negative.   Respiratory: Negative.    Cardiovascular: Negative.   Gastrointestinal: Negative.   Genitourinary: Negative.   Musculoskeletal:  Positive for back pain.  Skin: Negative.   Neurological: Negative.   Endo/Heme/Allergies: Negative.   Psychiatric/Behavioral: Negative.            Objective:   Vitals:   BP 122/76   Pulse (!) 107   Temp 97.7 F (36.5 C) (Temporal)   Resp 18   Ht 5\' 6"  (1.676 m)   Wt 250 lb 9.6 oz (113.7 kg)   SpO2 98%   BMI 40.45 kg/m    Physical Examination Physical Exam Vitals reviewed.  Constitutional:      Appearance: Normal appearance. She is obese. She is not ill-appearing.  HENT:     Head: Normocephalic and atraumatic.     Right Ear: Tympanic membrane, ear canal and external ear normal.     Left Ear: Tympanic membrane, ear canal and external ear normal.     Nose: Nose normal. No congestion or  rhinorrhea.     Mouth/Throat:     Mouth: Mucous membranes are moist.     Pharynx: Oropharynx is clear. No oropharyngeal exudate or posterior oropharyngeal erythema.  Eyes:     Conjunctiva/sclera: Conjunctivae normal.     Pupils: Pupils are equal, round, and reactive to light.  Cardiovascular:     Rate and Rhythm: Normal rate and regular rhythm.     Pulses: Normal pulses.     Heart sounds: Normal heart sounds. No murmur heard. Pulmonary:     Effort: Pulmonary effort is normal.     Breath sounds: Normal breath sounds.  Abdominal:     General: Abdomen is protuberant. Bowel sounds are normal. There is no distension.     Palpations: Abdomen is soft. There is no shifting dullness, hepatomegaly or mass.     Tenderness: There is no abdominal tenderness. There is no right CVA tenderness or left CVA tenderness.  Musculoskeletal:        General: Tenderness present. No signs of injury. Normal range of motion.     Cervical back: Normal, normal range of motion and neck supple.     Thoracic back: Normal.     Lumbar back: Tenderness present. No swelling, edema, spasms or bony tenderness. Normal range of motion.     Right lower leg: No edema.     Left lower leg: No edema.  Lymphadenopathy:     Cervical: No cervical adenopathy.  Skin:    General: Skin is warm and dry.     Capillary Refill: Capillary refill takes less than 2 seconds.  Neurological:     General: No focal deficit present.     Mental Status: She is alert and oriented to person, place, and time.     Cranial Nerves: No cranial nerve deficit.     Motor: No weakness.  Psychiatric:        Mood and  Affect: Mood normal.        Behavior: Behavior normal.        Thought Content: Thought content normal.        Judgment: Judgment normal.          Assessment & Plan:     Annual physical exam CPE completed today with screening labs: - Planned laboratory tests, including CBC, kidney function, liver function, lipid panel, thyroid  function, vitamin D  levels, hepatitis C, and HIV screening.   USPSTF grade A and B recommendations reviewed with patient; age-appropriate recommendations, preventive care, screening tests, etc discussed and encouraged; healthy living encouraged; see AVS for patient education given to patient   Discussed importance of 150 minutes of physical activity weekly, AHA exercise recommendations given to pt in AVS/handout   Discussed importance of healthy diet:  eating lean meats and proteins, avoiding trans fats and saturated fats, avoid simple sugars and excessive carbs in diet, eat 6 servings of fruit/vegetables daily and drink plenty of water and avoid sweet beverages.     Recommended pt to do annual eye exam and routine dental exams/cleanings   Depression, alcohol, fall screening completed as documented above and per flowsheets   Advance Care planning information and packet discussed and offered today, encouraged pt to discuss with family members/spouse/partner/friends and complete Advanced directive packet and bring copy to office   Screening for colon cancer Ordered Cologuard test for colon cancer screening.  Encounter for screening mammogram for malignant neoplasm of breast Ordered mammogram for [facility].   Screening for diabetes mellitus (DM) Same as above   Vitamin D  deficiency Patient currently on vitamin D  supplements. We will evaluate vitamin D  level.     Return if symptoms worsen or fail to improve.       Reviewed Health Maintenance:   Health Maintenance  Topic Date Due   HIV Screening  Never done   Hepatitis C Screening  Never done   Colonoscopy  Never done   DTaP/Tdap/Td (2 - Td or Tdap) 06/07/2024 (Originally 10/15/2021)   INFLUENZA VACCINE  09/09/2023   HPV VACCINES  Aged Out   Meningococcal B Vaccine  Aged Out   COVID-19 Vaccine  Discontinued      Trina Fujita Ragan Reale, NP 06/08/23 10:36 AM

## 2023-06-08 NOTE — Assessment & Plan Note (Addendum)
 Patient currently on vitamin D  supplements. We will evaluate vitamin D  level.

## 2023-06-09 LAB — COMPREHENSIVE METABOLIC PANEL WITH GFR
ALT: 20 IU/L (ref 0–32)
AST: 18 IU/L (ref 0–40)
Albumin: 4 g/dL (ref 3.9–4.9)
Alkaline Phosphatase: 79 IU/L (ref 44–121)
BUN/Creatinine Ratio: 28 — ABNORMAL HIGH (ref 9–23)
BUN: 20 mg/dL (ref 6–24)
Bilirubin Total: 0.4 mg/dL (ref 0.0–1.2)
CO2: 24 mmol/L (ref 20–29)
Calcium: 9.2 mg/dL (ref 8.7–10.2)
Chloride: 100 mmol/L (ref 96–106)
Creatinine, Ser: 0.71 mg/dL (ref 0.57–1.00)
Globulin, Total: 2.5 g/dL (ref 1.5–4.5)
Glucose: 103 mg/dL — ABNORMAL HIGH (ref 70–99)
Potassium: 4.2 mmol/L (ref 3.5–5.2)
Sodium: 138 mmol/L (ref 134–144)
Total Protein: 6.5 g/dL (ref 6.0–8.5)
eGFR: 106 mL/min/{1.73_m2} (ref >59)

## 2023-06-09 LAB — CBC
Hematocrit: 38.2 % (ref 34.0–46.6)
Hemoglobin: 12.5 g/dL (ref 11.1–15.9)
MCH: 28.4 pg (ref 26.6–33.0)
MCHC: 32.7 g/dL (ref 31.5–35.7)
MCV: 87 fL (ref 79–97)
Platelets: 346 10*3/uL (ref 150–450)
RBC: 4.4 x10E6/uL (ref 3.77–5.28)
RDW: 13 % (ref 11.7–15.4)
WBC: 5.9 10*3/uL (ref 3.4–10.8)

## 2023-06-09 LAB — LIPID PANEL
Chol/HDL Ratio: 5.1 ratio — ABNORMAL HIGH (ref 0.0–4.4)
Cholesterol, Total: 185 mg/dL (ref 100–199)
HDL: 36 mg/dL — ABNORMAL LOW (ref >39)
LDL Chol Calc (NIH): 123 mg/dL — ABNORMAL HIGH (ref 0–99)
Triglycerides: 146 mg/dL (ref 0–149)
VLDL Cholesterol Cal: 26 mg/dL (ref 5–40)

## 2023-06-09 LAB — HEMOGLOBIN A1C
Est. average glucose Bld gHb Est-mCnc: 108 mg/dL
Hgb A1c MFr Bld: 5.4 % (ref 4.8–5.6)

## 2023-06-09 LAB — HIV ANTIBODY (ROUTINE TESTING W REFLEX): HIV Screen 4th Generation wRfx: NONREACTIVE

## 2023-06-09 LAB — VITAMIN D 25 HYDROXY (VIT D DEFICIENCY, FRACTURES): Vit D, 25-Hydroxy: 26.9 ng/mL — ABNORMAL LOW (ref 30.0–100.0)

## 2023-06-09 LAB — TSH: TSH: 4.48 u[IU]/mL (ref 0.450–4.500)

## 2023-06-09 LAB — HEPATITIS C ANTIBODY: Hep C Virus Ab: NONREACTIVE

## 2023-08-07 LAB — COLOGUARD: COLOGUARD: NEGATIVE

## 2023-08-17 ENCOUNTER — Ambulatory Visit: Admitting: Internal Medicine
